# Patient Record
Sex: Male | Born: 1945 | Race: White | Hispanic: No | Marital: Married | Smoking: Former smoker
Health system: Southern US, Community
[De-identification: ages and names within clinical notes are randomized; demographics above are authoritative.]

## PROBLEM LIST (undated history)

## (undated) DIAGNOSIS — G7100 Muscular dystrophy, unspecified: Secondary | ICD-10-CM

## (undated) DIAGNOSIS — I252 Old myocardial infarction: Secondary | ICD-10-CM

## (undated) DIAGNOSIS — Z950 Presence of cardiac pacemaker: Secondary | ICD-10-CM

## (undated) DIAGNOSIS — R42 Dizziness and giddiness: Secondary | ICD-10-CM

## (undated) DIAGNOSIS — G4730 Sleep apnea, unspecified: Secondary | ICD-10-CM

## (undated) DIAGNOSIS — E785 Hyperlipidemia, unspecified: Secondary | ICD-10-CM

## (undated) DIAGNOSIS — I2510 Atherosclerotic heart disease of native coronary artery without angina pectoris: Secondary | ICD-10-CM

## (undated) DIAGNOSIS — I519 Heart disease, unspecified: Secondary | ICD-10-CM

## (undated) HISTORY — PX: BACK SURGERY: SHX140

## (undated) HISTORY — DX: Hyperlipidemia, unspecified: E78.5

## (undated) HISTORY — DX: Old myocardial infarction: I25.2

## (undated) HISTORY — DX: Muscular dystrophy, unspecified: G71.00

## (undated) HISTORY — DX: Sleep apnea, unspecified: G47.30

## (undated) HISTORY — PX: HERNIA REPAIR: SHX51

## (undated) HISTORY — DX: Atherosclerotic heart disease of native coronary artery without angina pectoris: I25.10

## (undated) HISTORY — DX: Heart disease, unspecified: I51.9

## (undated) HISTORY — DX: Dizziness and giddiness: R42

---
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
## 2006-03-23 ENCOUNTER — Ambulatory Visit (HOSPITAL_COMMUNITY): Admission: RE | Admit: 2006-03-23 | Discharge: 2006-03-23 | Payer: Self-pay | Admitting: Cardiology

## 2014-05-30 DIAGNOSIS — I2510 Atherosclerotic heart disease of native coronary artery without angina pectoris: Secondary | ICD-10-CM | POA: Diagnosis not present

## 2014-05-30 DIAGNOSIS — E785 Hyperlipidemia, unspecified: Secondary | ICD-10-CM | POA: Diagnosis not present

## 2014-06-11 DIAGNOSIS — S0990XA Unspecified injury of head, initial encounter: Secondary | ICD-10-CM | POA: Diagnosis not present

## 2014-06-11 DIAGNOSIS — Z87891 Personal history of nicotine dependence: Secondary | ICD-10-CM | POA: Diagnosis not present

## 2014-06-11 DIAGNOSIS — S0181XA Laceration without foreign body of other part of head, initial encounter: Secondary | ICD-10-CM | POA: Diagnosis not present

## 2014-06-11 DIAGNOSIS — W010XXA Fall on same level from slipping, tripping and stumbling without subsequent striking against object, initial encounter: Secondary | ICD-10-CM | POA: Diagnosis not present

## 2014-06-11 DIAGNOSIS — Z7982 Long term (current) use of aspirin: Secondary | ICD-10-CM | POA: Diagnosis not present

## 2014-06-11 DIAGNOSIS — R51 Headache: Secondary | ICD-10-CM | POA: Diagnosis not present

## 2014-06-11 DIAGNOSIS — S01411A Laceration without foreign body of right cheek and temporomandibular area, initial encounter: Secondary | ICD-10-CM | POA: Diagnosis not present

## 2014-06-11 DIAGNOSIS — Z792 Long term (current) use of antibiotics: Secondary | ICD-10-CM | POA: Diagnosis not present

## 2014-06-21 DIAGNOSIS — H3531 Nonexudative age-related macular degeneration: Secondary | ICD-10-CM | POA: Diagnosis not present

## 2014-08-08 DIAGNOSIS — Z125 Encounter for screening for malignant neoplasm of prostate: Secondary | ICD-10-CM | POA: Diagnosis not present

## 2014-08-08 DIAGNOSIS — E782 Mixed hyperlipidemia: Secondary | ICD-10-CM | POA: Diagnosis not present

## 2014-08-08 DIAGNOSIS — Z79899 Other long term (current) drug therapy: Secondary | ICD-10-CM | POA: Diagnosis not present

## 2014-08-08 DIAGNOSIS — Z1211 Encounter for screening for malignant neoplasm of colon: Secondary | ICD-10-CM | POA: Diagnosis not present

## 2014-08-08 DIAGNOSIS — R2689 Other abnormalities of gait and mobility: Secondary | ICD-10-CM | POA: Diagnosis not present

## 2014-11-20 DIAGNOSIS — E785 Hyperlipidemia, unspecified: Secondary | ICD-10-CM

## 2014-11-20 DIAGNOSIS — I2510 Atherosclerotic heart disease of native coronary artery without angina pectoris: Secondary | ICD-10-CM

## 2014-11-20 HISTORY — DX: Hyperlipidemia, unspecified: E78.5

## 2014-11-20 HISTORY — DX: Atherosclerotic heart disease of native coronary artery without angina pectoris: I25.10

## 2015-02-08 DIAGNOSIS — E782 Mixed hyperlipidemia: Secondary | ICD-10-CM | POA: Diagnosis not present

## 2015-02-08 DIAGNOSIS — Z0000 Encounter for general adult medical examination without abnormal findings: Secondary | ICD-10-CM | POA: Diagnosis not present

## 2015-02-08 DIAGNOSIS — Z760 Encounter for issue of repeat prescription: Secondary | ICD-10-CM | POA: Diagnosis not present

## 2015-02-08 DIAGNOSIS — Z79899 Other long term (current) drug therapy: Secondary | ICD-10-CM | POA: Diagnosis not present

## 2015-02-08 DIAGNOSIS — R2689 Other abnormalities of gait and mobility: Secondary | ICD-10-CM | POA: Diagnosis not present

## 2015-02-08 DIAGNOSIS — E780 Pure hypercholesterolemia: Secondary | ICD-10-CM | POA: Diagnosis not present

## 2015-06-20 DIAGNOSIS — E785 Hyperlipidemia, unspecified: Secondary | ICD-10-CM | POA: Diagnosis not present

## 2015-06-20 DIAGNOSIS — I2510 Atherosclerotic heart disease of native coronary artery without angina pectoris: Secondary | ICD-10-CM | POA: Diagnosis not present

## 2015-06-25 DIAGNOSIS — I2510 Atherosclerotic heart disease of native coronary artery without angina pectoris: Secondary | ICD-10-CM | POA: Diagnosis not present

## 2015-06-25 DIAGNOSIS — E785 Hyperlipidemia, unspecified: Secondary | ICD-10-CM | POA: Diagnosis not present

## 2015-08-05 DIAGNOSIS — Z79899 Other long term (current) drug therapy: Secondary | ICD-10-CM | POA: Diagnosis not present

## 2015-08-05 DIAGNOSIS — E782 Mixed hyperlipidemia: Secondary | ICD-10-CM | POA: Diagnosis not present

## 2015-08-05 DIAGNOSIS — D510 Vitamin B12 deficiency anemia due to intrinsic factor deficiency: Secondary | ICD-10-CM | POA: Diagnosis not present

## 2015-10-01 DIAGNOSIS — I2510 Atherosclerotic heart disease of native coronary artery without angina pectoris: Secondary | ICD-10-CM | POA: Diagnosis not present

## 2015-10-01 DIAGNOSIS — E785 Hyperlipidemia, unspecified: Secondary | ICD-10-CM | POA: Diagnosis not present

## 2016-02-10 DIAGNOSIS — Z79899 Other long term (current) drug therapy: Secondary | ICD-10-CM | POA: Diagnosis not present

## 2016-02-10 DIAGNOSIS — G319 Degenerative disease of nervous system, unspecified: Secondary | ICD-10-CM | POA: Diagnosis not present

## 2016-02-10 DIAGNOSIS — E782 Mixed hyperlipidemia: Secondary | ICD-10-CM | POA: Diagnosis not present

## 2016-02-10 DIAGNOSIS — Z0000 Encounter for general adult medical examination without abnormal findings: Secondary | ICD-10-CM | POA: Diagnosis not present

## 2016-02-10 DIAGNOSIS — Z2821 Immunization not carried out because of patient refusal: Secondary | ICD-10-CM | POA: Diagnosis not present

## 2016-03-31 DIAGNOSIS — I2510 Atherosclerotic heart disease of native coronary artery without angina pectoris: Secondary | ICD-10-CM | POA: Diagnosis not present

## 2016-03-31 DIAGNOSIS — E785 Hyperlipidemia, unspecified: Secondary | ICD-10-CM | POA: Diagnosis not present

## 2016-09-23 DIAGNOSIS — E785 Hyperlipidemia, unspecified: Secondary | ICD-10-CM | POA: Diagnosis not present

## 2016-09-23 DIAGNOSIS — I2510 Atherosclerotic heart disease of native coronary artery without angina pectoris: Secondary | ICD-10-CM | POA: Diagnosis not present

## 2017-01-07 DIAGNOSIS — E782 Mixed hyperlipidemia: Secondary | ICD-10-CM | POA: Diagnosis not present

## 2017-01-07 DIAGNOSIS — K649 Unspecified hemorrhoids: Secondary | ICD-10-CM | POA: Diagnosis not present

## 2017-01-07 DIAGNOSIS — R2689 Other abnormalities of gait and mobility: Secondary | ICD-10-CM | POA: Diagnosis not present

## 2017-01-07 DIAGNOSIS — Z79899 Other long term (current) drug therapy: Secondary | ICD-10-CM | POA: Diagnosis not present

## 2017-01-07 DIAGNOSIS — Z6822 Body mass index (BMI) 22.0-22.9, adult: Secondary | ICD-10-CM | POA: Diagnosis not present

## 2017-01-26 ENCOUNTER — Other Ambulatory Visit: Payer: Self-pay | Admitting: Cardiology

## 2017-01-28 NOTE — Telephone Encounter (Signed)
Attempted to contact pt to see if he could continue care with Dr. Tomie Chinaevankar before refilling medication. This seemed to be invalid number for the pt.

## 2017-02-19 DIAGNOSIS — I252 Old myocardial infarction: Secondary | ICD-10-CM

## 2017-02-19 DIAGNOSIS — E785 Hyperlipidemia, unspecified: Secondary | ICD-10-CM

## 2017-02-19 HISTORY — DX: Hyperlipidemia, unspecified: E78.5

## 2017-02-19 HISTORY — DX: Old myocardial infarction: I25.2

## 2017-02-22 DIAGNOSIS — R42 Dizziness and giddiness: Secondary | ICD-10-CM | POA: Diagnosis not present

## 2017-02-22 DIAGNOSIS — I252 Old myocardial infarction: Secondary | ICD-10-CM | POA: Diagnosis not present

## 2017-02-22 DIAGNOSIS — E785 Hyperlipidemia, unspecified: Secondary | ICD-10-CM | POA: Diagnosis not present

## 2017-02-22 DIAGNOSIS — G7100 Muscular dystrophy, unspecified: Secondary | ICD-10-CM | POA: Diagnosis not present

## 2017-02-22 DIAGNOSIS — R9431 Abnormal electrocardiogram [ECG] [EKG]: Secondary | ICD-10-CM | POA: Diagnosis not present

## 2017-02-22 DIAGNOSIS — I2510 Atherosclerotic heart disease of native coronary artery without angina pectoris: Secondary | ICD-10-CM | POA: Diagnosis not present

## 2017-02-22 DIAGNOSIS — I519 Heart disease, unspecified: Secondary | ICD-10-CM

## 2017-02-22 DIAGNOSIS — I440 Atrioventricular block, first degree: Secondary | ICD-10-CM | POA: Diagnosis not present

## 2017-02-22 HISTORY — DX: Muscular dystrophy, unspecified: G71.00

## 2017-02-22 HISTORY — DX: Heart disease, unspecified: I51.9

## 2017-02-22 HISTORY — DX: Dizziness and giddiness: R42

## 2017-03-09 DIAGNOSIS — I519 Heart disease, unspecified: Secondary | ICD-10-CM | POA: Diagnosis not present

## 2017-03-09 DIAGNOSIS — I252 Old myocardial infarction: Secondary | ICD-10-CM | POA: Diagnosis not present

## 2017-03-09 DIAGNOSIS — E785 Hyperlipidemia, unspecified: Secondary | ICD-10-CM | POA: Diagnosis not present

## 2017-03-09 DIAGNOSIS — I2510 Atherosclerotic heart disease of native coronary artery without angina pectoris: Secondary | ICD-10-CM | POA: Diagnosis not present

## 2017-03-09 DIAGNOSIS — G7100 Muscular dystrophy, unspecified: Secondary | ICD-10-CM | POA: Diagnosis not present

## 2017-03-24 DIAGNOSIS — G4733 Obstructive sleep apnea (adult) (pediatric): Secondary | ICD-10-CM | POA: Diagnosis not present

## 2017-03-31 DIAGNOSIS — I361 Nonrheumatic tricuspid (valve) insufficiency: Secondary | ICD-10-CM | POA: Diagnosis not present

## 2017-03-31 DIAGNOSIS — I517 Cardiomegaly: Secondary | ICD-10-CM | POA: Diagnosis not present

## 2017-05-11 DIAGNOSIS — G7100 Muscular dystrophy, unspecified: Secondary | ICD-10-CM | POA: Diagnosis not present

## 2017-05-11 DIAGNOSIS — G4730 Sleep apnea, unspecified: Secondary | ICD-10-CM | POA: Insufficient documentation

## 2017-05-11 DIAGNOSIS — I2510 Atherosclerotic heart disease of native coronary artery without angina pectoris: Secondary | ICD-10-CM | POA: Diagnosis not present

## 2017-05-11 DIAGNOSIS — R42 Dizziness and giddiness: Secondary | ICD-10-CM | POA: Diagnosis not present

## 2017-05-11 DIAGNOSIS — I252 Old myocardial infarction: Secondary | ICD-10-CM | POA: Diagnosis not present

## 2017-05-11 HISTORY — DX: Sleep apnea, unspecified: G47.30

## 2017-05-21 DIAGNOSIS — G4733 Obstructive sleep apnea (adult) (pediatric): Secondary | ICD-10-CM | POA: Diagnosis not present

## 2017-06-08 DIAGNOSIS — G4733 Obstructive sleep apnea (adult) (pediatric): Secondary | ICD-10-CM | POA: Diagnosis not present

## 2017-06-08 DIAGNOSIS — R0600 Dyspnea, unspecified: Secondary | ICD-10-CM | POA: Diagnosis not present

## 2017-07-15 DIAGNOSIS — G4733 Obstructive sleep apnea (adult) (pediatric): Secondary | ICD-10-CM | POA: Diagnosis not present

## 2017-07-16 DIAGNOSIS — G4733 Obstructive sleep apnea (adult) (pediatric): Secondary | ICD-10-CM | POA: Diagnosis not present

## 2017-07-20 DIAGNOSIS — H353132 Nonexudative age-related macular degeneration, bilateral, intermediate dry stage: Secondary | ICD-10-CM | POA: Diagnosis not present

## 2017-08-09 DIAGNOSIS — G4700 Insomnia, unspecified: Secondary | ICD-10-CM | POA: Diagnosis not present

## 2017-08-09 DIAGNOSIS — G4733 Obstructive sleep apnea (adult) (pediatric): Secondary | ICD-10-CM | POA: Diagnosis not present

## 2017-08-09 DIAGNOSIS — R0600 Dyspnea, unspecified: Secondary | ICD-10-CM | POA: Diagnosis not present

## 2017-08-12 DIAGNOSIS — G4733 Obstructive sleep apnea (adult) (pediatric): Secondary | ICD-10-CM | POA: Diagnosis not present

## 2017-08-17 ENCOUNTER — Other Ambulatory Visit: Payer: Self-pay | Admitting: Cardiology

## 2017-08-17 NOTE — Telephone Encounter (Signed)
Left voicemail regarding refill; appointment needed.

## 2017-08-25 DIAGNOSIS — G4733 Obstructive sleep apnea (adult) (pediatric): Secondary | ICD-10-CM | POA: Diagnosis not present

## 2017-09-12 DIAGNOSIS — G4733 Obstructive sleep apnea (adult) (pediatric): Secondary | ICD-10-CM | POA: Diagnosis not present

## 2017-09-24 ENCOUNTER — Other Ambulatory Visit: Payer: Self-pay

## 2017-10-13 ENCOUNTER — Telehealth: Payer: Self-pay

## 2017-10-13 ENCOUNTER — Encounter: Payer: Self-pay | Admitting: Cardiology

## 2017-10-13 ENCOUNTER — Ambulatory Visit: Payer: Medicare Other | Admitting: Cardiology

## 2017-10-13 VITALS — BP 122/64 | HR 51 | Ht 67.0 in | Wt 146.0 lb

## 2017-10-13 DIAGNOSIS — I2510 Atherosclerotic heart disease of native coronary artery without angina pectoris: Secondary | ICD-10-CM | POA: Diagnosis not present

## 2017-10-13 DIAGNOSIS — E782 Mixed hyperlipidemia: Secondary | ICD-10-CM

## 2017-10-13 DIAGNOSIS — G7100 Muscular dystrophy, unspecified: Secondary | ICD-10-CM

## 2017-10-13 DIAGNOSIS — G4730 Sleep apnea, unspecified: Secondary | ICD-10-CM

## 2017-10-13 NOTE — Progress Notes (Signed)
Cardiology Office Note:    Date:  10/13/2017   ID:  Stephen Howell, DOB March 12, 1946, MRN [id number]  PCP:  Heywood Bene, MD  Cardiologist:  Garwin Brothers, MD   Referring MD: No ref. provider found    ASSESSMENT:    1. Coronary artery disease involving native coronary artery of native heart without angina pectoris   2. Mixed hyperlipidemia   3. Muscular dystrophy (HCC)   4. Sleep apnea, unspecified type    PLAN:    In order of problems listed above:  1. Secondary prevention stressed with patient.  Importance of compliance with diet and medications stressed and he vocalized understanding.  His blood pressure stable.  He is limited in activity and ambulation because of muscular dystrophy.  He tries his best to stay active.  He 2. Has been a long time since he has had blood work and he is fasting today so we will obtain complete blood work including fasting lipids. 3. I will try to obtain results of sleep study done at sleep study specialist in [place] and advise him accordingly. 4. We will reinitiate his statin once we review his lipids. 5. Patient will be seen in follow-up appointment in 6 months or earlier if the patient has any concerns    Medication Adjustments/Labs and Tests Ordered: Current medicines are reviewed at length with the patient today.  Concerns regarding medicines are outlined above.  Orders Placed This Encounter  Procedures  . Basic metabolic panel  . CBC with Differential/Platelet  . TSH  . Hepatic function panel  . Lipid panel  . EKG 12-Lead   No orders of the defined types were placed in this encounter.    Stephen Complaint  Patient presents with  . Follow-up  . Coronary Artery Disease     History of Present Illness:    Stephen Howell is a 72 y.o. male.  He has known coronary artery disease.  This patient has been under my care in my previous practice.  He is here now to transfer his care and be established with my current practice.  He  has muscular dystrophy.  He mentions to me that he underwent sleep study but did not get the results of his tests.  Also he mentions to me that he has had issues refilling his medications from the previous practice and therefore he is frustrated and has made an appointment to see me.  He is accompanied by his wife.  He denies any chest pain orthopnea or PND.  At the time of my evaluation, the patient is alert awake oriented and in no distress.  Past Medical History:  Diagnosis Date  . CAD (coronary artery disease) 11/20/2014  . Dizziness 02/22/2017  . Dyslipidemia 11/20/2014  . Hyperlipidemia 02/19/2017  . Left ventricular dysfunction 02/22/2017  . Muscular dystrophy (HCC) 02/22/2017  . 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 Sleep apnea 05/11/2017    Past Surgical History:  Procedure Laterality Date  . BACK SURGERY    . HERNIA REPAIR      Current Medications: Current Meds  Medication Sig  . aspirin EC 81 MG tablet Take 81 mg by mouth daily.  . Coenzyme Q-10 200 MG CAPS Take 200 mg by mouth daily.  . nitroGLYCERIN (NITROSTAT) 0.4 MG SL tablet Place 0.4 mg under the tongue every 5 (five) minutes as needed.  . [DISCONTINUED] clopidogrel (PLAVIX) 75 MG tablet Take 1 tablet by mouth daily.  . [DISCONTINUED] pravastatin (PRAVACHOL) 10 MG tablet  Take 1 tablet by mouth daily.     Allergies:   Atorvastatin   Social History   Socioeconomic History  . Marital status: Married    Spouse name: Not on file  . Number of children: Not on file  . Years of education: Not on file  . Highest education level: Not on file  Occupational History  . Not on file  Social Needs  . Financial resource strain: Not on file  . Food insecurity:    Worry: Not on file    Inability: Not on file  . Transportation needs:    Medical: Not on file    Non-medical: Not on file  Tobacco Use  . Smoking status: Former Games developer  . Smokeless tobacco: Never Used  Substance and Sexual Activity  . Alcohol use: Not on  file  . Drug use: Not on file  . Sexual activity: Not on file  Lifestyle  . Physical activity:    Days per week: Not on file    Minutes per session: Not on file  . Stress: Not on file  Relationships  . Social connections:    Talks on phone: Not on file    Gets together: Not on file    Attends religious service: Not on file    Active member of club or organization: Not on file    Attends meetings of clubs or organizations: Not on file    Relationship status: Not on file  Other Topics Concern  . Not on file  Social History Narrative  . Not on file     Family History: The patient's family history includes Cancer in his mother; Muscular dystrophy in his brother.  ROS:   Please see the history of present illness.    All other systems reviewed and are negative.  EKGs/Labs/Other Studies Reviewed:    The following studies were reviewed today: EKG reveals sinus rhythm with first-degree AV block and nonspecific ST-T changes.   Recent Labs: No results found for requested labs within last 8760 hours.  Recent Lipid Panel No results found for: CHOL, TRIG, HDL, CHOLHDL, VLDL, LDLCALC, LDLDIRECT  Physical Exam:    VS:  BP 122/64 (BP Location: Right Arm, Patient Position: Sitting, Cuff Size: Normal)   Pulse (!) 51   Ht  (1.702 m)   Wt 146 lb (66.2 kg)   SpO2 95%   BMI 22.87 kg/m     Wt Readings from Last 3 Encounters:  10/13/17 146 lb (66.2 kg)     GEN: Patient is in no acute distress HEENT: Normal NECK: No JVD; No carotid bruits LYMPHATICS: No lymphadenopathy CARDIAC: Hear sounds regular, 2/6 systolic murmur at the apex. RESPIRATORY:  Clear to auscultation without rales, wheezing or rhonchi  ABDOMEN: Soft, non-tender, non-distended MUSCULOSKELETAL:  No edema; No deformity  SKIN: Warm and dry NEUROLOGIC:  Alert and oriented x 3 PSYCHIATRIC:  Normal affect   Signed, Garwin Brothers, MD  10/13/2017 9:20 AM    [facility]

## 2017-10-13 NOTE — Telephone Encounter (Signed)
Called patient to discuss pulmonary referral.

## 2017-10-13 NOTE — Patient Instructions (Addendum)
Medication Instructions:  Your physician recommends that you continue on your current medications as directed. Please refer to the Current Medication list given to you today.  Labwork: Your physician recommends that you have the following labs drawn: BMP, CBC, TSH, liver and lipid panel.  Testing/Procedures: None  Follow-Up: Your physician recommends that you schedule a follow-up appointment in: 6 months  Any Other Special Instructions Will Be Listed Below (If Applicable).  Sleep study will be requested from the office of Dr. Blenda Nicely.   If you need a refill on your cardiac medications before your next appointment, please call your pharmacy.   [facility]  Garey Ham, RN, BSN

## 2017-10-14 ENCOUNTER — Telehealth: Payer: Self-pay

## 2017-10-14 LAB — BASIC METABOLIC PANEL
BUN/Creatinine Ratio: 20 (ref 10–24)
BUN: 16 mg/dL (ref 8–27)
CALCIUM: 9.3 mg/dL (ref 8.6–10.2)
CO2: 27 mmol/L (ref 20–29)
CREATININE: 0.8 mg/dL (ref 0.76–1.27)
Chloride: 106 mmol/L (ref 96–106)
GFR calc Af Amer: 102 mL/min/{1.73_m2} (ref >59)
GFR, EST NON AFRICAN AMERICAN: 89 mL/min/{1.73_m2} (ref >59)
GLUCOSE: 93 mg/dL (ref 65–99)
POTASSIUM: 4.7 mmol/L (ref 3.5–5.2)
SODIUM: 147 mmol/L — AB (ref 134–144)

## 2017-10-14 LAB — CBC WITH DIFFERENTIAL/PLATELET
BASOS: 0 %
Basophils Absolute: 0 10*3/uL (ref 0.0–0.2)
EOS (ABSOLUTE): 0.3 10*3/uL (ref 0.0–0.4)
Eos: 4 %
Hematocrit: 44.4 % (ref 37.5–51.0)
Hemoglobin: 14.7 g/dL (ref 13.0–17.7)
IMMATURE GRANS (ABS): 0 10*3/uL (ref 0.0–0.1)
IMMATURE GRANULOCYTES: 0 %
LYMPHS: 28 %
Lymphocytes Absolute: 2 10*3/uL (ref 0.7–3.1)
MCH: 30.1 pg (ref 26.6–33.0)
MCHC: 33.1 g/dL (ref 31.5–35.7)
MCV: 91 fL (ref 79–97)
MONOS ABS: 0.5 10*3/uL (ref 0.1–0.9)
Monocytes: 7 %
Neutrophils Absolute: 4.1 10*3/uL (ref 1.4–7.0)
Neutrophils: 61 %
PLATELETS: 184 10*3/uL (ref 150–450)
RBC: 4.88 x10E6/uL (ref 4.14–5.80)
RDW: 15.4 % (ref 12.3–15.4)
WBC: 6.9 10*3/uL (ref 3.4–10.8)

## 2017-10-14 LAB — HEPATIC FUNCTION PANEL
ALT: 14 IU/L (ref 0–44)
AST: 14 IU/L (ref 0–40)
Albumin: 3.9 g/dL (ref 3.5–4.8)
Alkaline Phosphatase: 45 IU/L (ref 39–117)
BILIRUBIN TOTAL: 0.4 mg/dL (ref 0.0–1.2)
Bilirubin, Direct: 0.11 mg/dL (ref 0.00–0.40)
Total Protein: 6.2 g/dL (ref 6.0–8.5)

## 2017-10-14 LAB — TSH: TSH: 1.01 u[IU]/mL (ref 0.450–4.500)

## 2017-10-14 LAB — LIPID PANEL
CHOLESTEROL TOTAL: 173 mg/dL (ref 100–199)
Chol/HDL Ratio: 3.1 ratio (ref 0.0–5.0)
HDL: 56 mg/dL (ref >39)
LDL Calculated: 97 mg/dL (ref 0–99)
TRIGLYCERIDES: 102 mg/dL (ref 0–149)
VLDL Cholesterol Cal: 20 mg/dL (ref 5–40)

## 2017-10-14 NOTE — Telephone Encounter (Signed)
Left message with son for the patient to call the office back regarding results.

## 2017-10-15 MED ORDER — PRAVASTATIN SODIUM 10 MG PO TABS: 10.0000 mg | ORAL_TABLET | Freq: Every day | ORAL | 2 refills | Status: DC

## 2017-10-15 NOTE — Addendum Note (Signed)
Addended by: Lamona Curl on: 10/15/2017 04:34 PM   Modules accepted: Orders

## 2017-10-15 NOTE — Telephone Encounter (Signed)
Wife returned out call and I let her know the blood work showed no changes and to stay on statin but I'm not sure which one we need to call in. Also there is a note in here about a referral but not sure what needed to be relayed. Please advise.

## 2017-10-15 NOTE — Telephone Encounter (Signed)
After reviewing patients past medications with Santina Evans, RN a Rx for pravastatin  one tablet daily #90 was sent to [facility].  Patients wife Windell Moulding was advised that pravastatin was sent to pharmacy. Also, advised that Morrie Sheldon will contact patient regarding pulmonology referral once she returns to office.  Patients wife verbalized understanding.

## 2017-10-19 ENCOUNTER — Other Ambulatory Visit: Payer: Self-pay

## 2017-10-19 DIAGNOSIS — G4733 Obstructive sleep apnea (adult) (pediatric): Secondary | ICD-10-CM

## 2017-10-19 NOTE — Telephone Encounter (Signed)
Spoke with patient and inquired if patient would be willing to follow-up with a [facility] pulmonologist rather than continue care in [place]. Patient was agreeable, order has been placed.

## 2018-01-20 DIAGNOSIS — J189 Pneumonia, unspecified organism: Secondary | ICD-10-CM | POA: Diagnosis not present

## 2018-01-25 DIAGNOSIS — J9801 Acute bronchospasm: Secondary | ICD-10-CM | POA: Diagnosis not present

## 2018-01-25 DIAGNOSIS — R5381 Other malaise: Secondary | ICD-10-CM | POA: Diagnosis not present

## 2018-01-25 DIAGNOSIS — Z6822 Body mass index (BMI) 22.0-22.9, adult: Secondary | ICD-10-CM | POA: Diagnosis not present

## 2018-01-25 DIAGNOSIS — J189 Pneumonia, unspecified organism: Secondary | ICD-10-CM | POA: Diagnosis not present

## 2018-01-25 DIAGNOSIS — R5383 Other fatigue: Secondary | ICD-10-CM | POA: Diagnosis not present

## 2018-01-27 DIAGNOSIS — J189 Pneumonia, unspecified organism: Secondary | ICD-10-CM | POA: Diagnosis not present

## 2018-01-27 DIAGNOSIS — Z2821 Immunization not carried out because of patient refusal: Secondary | ICD-10-CM | POA: Diagnosis not present

## 2018-01-27 DIAGNOSIS — Z0000 Encounter for general adult medical examination without abnormal findings: Secondary | ICD-10-CM | POA: Diagnosis not present

## 2018-01-31 DIAGNOSIS — G4733 Obstructive sleep apnea (adult) (pediatric): Secondary | ICD-10-CM | POA: Diagnosis not present

## 2018-01-31 DIAGNOSIS — R0600 Dyspnea, unspecified: Secondary | ICD-10-CM | POA: Diagnosis not present

## 2018-02-01 DIAGNOSIS — G4733 Obstructive sleep apnea (adult) (pediatric): Secondary | ICD-10-CM | POA: Diagnosis not present

## 2018-02-12 DIAGNOSIS — G4733 Obstructive sleep apnea (adult) (pediatric): Secondary | ICD-10-CM | POA: Diagnosis not present

## 2018-02-21 DIAGNOSIS — J189 Pneumonia, unspecified organism: Secondary | ICD-10-CM | POA: Diagnosis not present

## 2018-03-14 DIAGNOSIS — G4733 Obstructive sleep apnea (adult) (pediatric): Secondary | ICD-10-CM | POA: Diagnosis not present

## 2018-03-29 DIAGNOSIS — Z7982 Long term (current) use of aspirin: Secondary | ICD-10-CM | POA: Diagnosis not present

## 2018-03-29 DIAGNOSIS — J9811 Atelectasis: Secondary | ICD-10-CM | POA: Diagnosis not present

## 2018-03-29 DIAGNOSIS — I252 Old myocardial infarction: Secondary | ICD-10-CM | POA: Diagnosis not present

## 2018-03-29 DIAGNOSIS — R070 Pain in throat: Secondary | ICD-10-CM | POA: Diagnosis not present

## 2018-03-29 DIAGNOSIS — R0602 Shortness of breath: Secondary | ICD-10-CM | POA: Diagnosis not present

## 2018-03-29 DIAGNOSIS — I2510 Atherosclerotic heart disease of native coronary artery without angina pectoris: Secondary | ICD-10-CM | POA: Diagnosis not present

## 2018-03-29 DIAGNOSIS — R1310 Dysphagia, unspecified: Secondary | ICD-10-CM | POA: Diagnosis not present

## 2018-03-29 DIAGNOSIS — J189 Pneumonia, unspecified organism: Secondary | ICD-10-CM | POA: Diagnosis not present

## 2018-03-29 DIAGNOSIS — Z79899 Other long term (current) drug therapy: Secondary | ICD-10-CM | POA: Diagnosis not present

## 2018-03-30 DIAGNOSIS — I2510 Atherosclerotic heart disease of native coronary artery without angina pectoris: Secondary | ICD-10-CM | POA: Diagnosis not present

## 2018-03-30 DIAGNOSIS — R2681 Unsteadiness on feet: Secondary | ICD-10-CM | POA: Diagnosis not present

## 2018-03-30 DIAGNOSIS — R1310 Dysphagia, unspecified: Secondary | ICD-10-CM | POA: Diagnosis not present

## 2018-03-30 DIAGNOSIS — G7100 Muscular dystrophy, unspecified: Secondary | ICD-10-CM | POA: Diagnosis not present

## 2018-03-31 DIAGNOSIS — R1310 Dysphagia, unspecified: Secondary | ICD-10-CM | POA: Diagnosis not present

## 2018-03-31 DIAGNOSIS — I2510 Atherosclerotic heart disease of native coronary artery without angina pectoris: Secondary | ICD-10-CM | POA: Diagnosis not present

## 2018-03-31 DIAGNOSIS — G7100 Muscular dystrophy, unspecified: Secondary | ICD-10-CM | POA: Diagnosis not present

## 2018-04-01 DIAGNOSIS — I2510 Atherosclerotic heart disease of native coronary artery without angina pectoris: Secondary | ICD-10-CM | POA: Diagnosis not present

## 2018-04-01 DIAGNOSIS — Z9181 History of falling: Secondary | ICD-10-CM | POA: Diagnosis not present

## 2018-04-01 DIAGNOSIS — I959 Hypotension, unspecified: Secondary | ICD-10-CM | POA: Diagnosis not present

## 2018-04-01 DIAGNOSIS — R531 Weakness: Secondary | ICD-10-CM | POA: Diagnosis not present

## 2018-04-01 DIAGNOSIS — Z7982 Long term (current) use of aspirin: Secondary | ICD-10-CM | POA: Diagnosis not present

## 2018-04-01 DIAGNOSIS — I252 Old myocardial infarction: Secondary | ICD-10-CM | POA: Diagnosis not present

## 2018-04-01 DIAGNOSIS — Z79899 Other long term (current) drug therapy: Secondary | ICD-10-CM | POA: Diagnosis not present

## 2018-04-01 DIAGNOSIS — Z87891 Personal history of nicotine dependence: Secondary | ICD-10-CM | POA: Diagnosis not present

## 2018-04-01 DIAGNOSIS — R001 Bradycardia, unspecified: Secondary | ICD-10-CM | POA: Diagnosis not present

## 2018-04-01 DIAGNOSIS — R1310 Dysphagia, unspecified: Secondary | ICD-10-CM | POA: Diagnosis not present

## 2018-04-04 DIAGNOSIS — Z6822 Body mass index (BMI) 22.0-22.9, adult: Secondary | ICD-10-CM | POA: Diagnosis not present

## 2018-04-04 DIAGNOSIS — G1220 Motor neuron disease, unspecified: Secondary | ICD-10-CM | POA: Diagnosis not present

## 2018-04-06 DIAGNOSIS — R1312 Dysphagia, oropharyngeal phase: Secondary | ICD-10-CM | POA: Diagnosis not present

## 2018-04-08 DIAGNOSIS — Z7982 Long term (current) use of aspirin: Secondary | ICD-10-CM | POA: Diagnosis not present

## 2018-04-08 DIAGNOSIS — I2510 Atherosclerotic heart disease of native coronary artery without angina pectoris: Secondary | ICD-10-CM | POA: Diagnosis not present

## 2018-04-08 DIAGNOSIS — I252 Old myocardial infarction: Secondary | ICD-10-CM | POA: Diagnosis not present

## 2018-04-08 DIAGNOSIS — Z9181 History of falling: Secondary | ICD-10-CM | POA: Diagnosis not present

## 2018-04-08 DIAGNOSIS — R1310 Dysphagia, unspecified: Secondary | ICD-10-CM | POA: Diagnosis not present

## 2018-04-11 ENCOUNTER — Ambulatory Visit: Payer: Medicare Other | Admitting: Cardiology

## 2018-04-11 DIAGNOSIS — R1310 Dysphagia, unspecified: Secondary | ICD-10-CM | POA: Diagnosis not present

## 2018-04-14 DIAGNOSIS — G4733 Obstructive sleep apnea (adult) (pediatric): Secondary | ICD-10-CM | POA: Diagnosis not present

## 2018-04-14 DIAGNOSIS — I252 Old myocardial infarction: Secondary | ICD-10-CM | POA: Diagnosis not present

## 2018-04-14 DIAGNOSIS — R1310 Dysphagia, unspecified: Secondary | ICD-10-CM | POA: Diagnosis not present

## 2018-04-14 DIAGNOSIS — Z9181 History of falling: Secondary | ICD-10-CM | POA: Diagnosis not present

## 2018-04-14 DIAGNOSIS — Z7982 Long term (current) use of aspirin: Secondary | ICD-10-CM | POA: Diagnosis not present

## 2018-04-14 DIAGNOSIS — I2510 Atherosclerotic heart disease of native coronary artery without angina pectoris: Secondary | ICD-10-CM | POA: Diagnosis not present

## 2018-04-19 DIAGNOSIS — R1310 Dysphagia, unspecified: Secondary | ICD-10-CM | POA: Diagnosis not present

## 2018-04-19 DIAGNOSIS — Z9181 History of falling: Secondary | ICD-10-CM | POA: Diagnosis not present

## 2018-04-19 DIAGNOSIS — I2510 Atherosclerotic heart disease of native coronary artery without angina pectoris: Secondary | ICD-10-CM | POA: Diagnosis not present

## 2018-04-19 DIAGNOSIS — I252 Old myocardial infarction: Secondary | ICD-10-CM | POA: Diagnosis not present

## 2018-04-19 DIAGNOSIS — Z7982 Long term (current) use of aspirin: Secondary | ICD-10-CM | POA: Diagnosis not present

## 2018-04-26 DIAGNOSIS — I2510 Atherosclerotic heart disease of native coronary artery without angina pectoris: Secondary | ICD-10-CM | POA: Diagnosis not present

## 2018-04-26 DIAGNOSIS — I252 Old myocardial infarction: Secondary | ICD-10-CM | POA: Diagnosis not present

## 2018-04-26 DIAGNOSIS — Z9181 History of falling: Secondary | ICD-10-CM | POA: Diagnosis not present

## 2018-04-26 DIAGNOSIS — R1310 Dysphagia, unspecified: Secondary | ICD-10-CM | POA: Diagnosis not present

## 2018-04-26 DIAGNOSIS — Z7982 Long term (current) use of aspirin: Secondary | ICD-10-CM | POA: Diagnosis not present

## 2018-04-28 DIAGNOSIS — Z7982 Long term (current) use of aspirin: Secondary | ICD-10-CM | POA: Diagnosis not present

## 2018-04-28 DIAGNOSIS — I2510 Atherosclerotic heart disease of native coronary artery without angina pectoris: Secondary | ICD-10-CM | POA: Diagnosis not present

## 2018-04-28 DIAGNOSIS — I252 Old myocardial infarction: Secondary | ICD-10-CM | POA: Diagnosis not present

## 2018-04-28 DIAGNOSIS — R1310 Dysphagia, unspecified: Secondary | ICD-10-CM | POA: Diagnosis not present

## 2018-04-28 DIAGNOSIS — Z9181 History of falling: Secondary | ICD-10-CM | POA: Diagnosis not present

## 2018-05-03 ENCOUNTER — Ambulatory Visit: Payer: Medicare Other | Admitting: Cardiology

## 2018-05-03 ENCOUNTER — Encounter: Payer: Self-pay | Admitting: Cardiology

## 2018-05-03 VITALS — BP 110/62 | HR 52 | Ht 67.0 in | Wt 148.0 lb

## 2018-05-03 DIAGNOSIS — E782 Mixed hyperlipidemia: Secondary | ICD-10-CM | POA: Diagnosis not present

## 2018-05-03 DIAGNOSIS — I2510 Atherosclerotic heart disease of native coronary artery without angina pectoris: Secondary | ICD-10-CM

## 2018-05-03 DIAGNOSIS — G7100 Muscular dystrophy, unspecified: Secondary | ICD-10-CM

## 2018-05-03 LAB — TSH: TSH: 1.31 u[IU]/mL (ref 0.450–4.500)

## 2018-05-03 NOTE — Progress Notes (Signed)
Cardiology Office Note:    Date:  05/03/2018   ID:  HAMSA LAURICH, DOB Nov 20, 1945, MRN [id number]  PCP:  Heywood Bene, MD  Cardiologist:  Garwin Brothers, MD   Referring MD: Heywood Bene, MD    ASSESSMENT:    1. Coronary artery disease involving native coronary artery of native heart without angina pectoris   2. Mixed hyperlipidemia   3. Muscular dystrophy (HCC)    PLAN:    In order of problems listed above:  1. Secondary prevention stressed with the patient.  Importance of compliance with diet and medication stressed and he vocalized understanding.  His blood pressure is stable.  Diet was discussed for dyslipidemia.  I reviewed emergency room records.  Also in view of the fact that he has not had a lipid check for some time we will do a lipid check and a TSH.  The TSH will be for bradycardia arrhythmia issues. 2. Patient will be seen in follow-up appointment in 6 months or earlier if the patient has any concerns    Medication Adjustments/Labs and Tests Ordered: Current medicines are reviewed at length with the patient today.  Concerns regarding medicines are outlined above.  Orders Placed This Encounter  Procedures  . TSH  . Lipid panel  . EKG 12-Lead   No orders of the defined types were placed in this encounter.    Chief Complaint  Patient presents with  . Follow-up     History of Present Illness:    Stephen Howell is a 72 y.o. male.  Patient has history of coronary artery disease.  He denies any problems at this time and takes care of activities of daily living.  No chest pain orthopnea or PND.  The patient went to the emergency room for what appears to be noncardiac related symptoms and was evaluated and discharged.  Subsequently is done well.  He has issues with bradycardia.  He denies any dizzy spells or any syncopal episodes.  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  His wife concurs that he is asymptomatic.  At the time of my  evaluation, the patient is alert awake oriented and in no distress.  Past Medical History:  Diagnosis Date  . CAD (coronary artery disease) 11/20/2014  . Dizziness 02/22/2017  . Dyslipidemia 11/20/2014  . Hyperlipidemia 02/19/2017  . Left ventricular dysfunction 02/22/2017  . Muscular dystrophy (HCC) 02/22/2017  . 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 Sleep apnea 05/11/2017    Past Surgical History:  Procedure Laterality Date  . BACK SURGERY    . HERNIA REPAIR      Current Medications: Current Meds  Medication Sig  . aspirin EC 81 MG tablet Take 81 mg by mouth daily.  . Coenzyme Q-10 200 MG CAPS Take 200 mg by mouth daily.  . nitroGLYCERIN (NITROSTAT) 0.4 MG SL tablet Place 0.4 mg under the tongue every 5 (five) minutes as needed.  . pravastatin (PRAVACHOL) 10 MG tablet Take 1 tablet (10 mg total) by mouth daily.     Allergies:   Atorvastatin   Social History   Socioeconomic History  . Marital status: Married    Spouse name: Not on file  . Number of children: Not on file  . Years of education: Not on file  . Highest education level: Not on file  Occupational History  . Not on file  Social Needs  . Financial resource strain: Not on file  . Food insecurity:  Worry: Not on file    Inability: Not on file  . Transportation needs:    Medical: Not on file    Non-medical: Not on file  Tobacco Use  . Smoking status: Former Games developermoker  . Smokeless tobacco: Never Used  Substance and Sexual Activity  . Alcohol use: Not on file  . Drug use: Not on file  . Sexual activity: Not on file  Lifestyle  . Physical activity:    Days per week: Not on file    Minutes per session: Not on file  . Stress: Not on file  Relationships  . Social connections:    Talks on phone: Not on file    Gets together: Not on file    Attends religious service: Not on file    Active member of club or organization: Not on file    Attends meetings of clubs or organizations: Not on file     Relationship status: Not on file  Other Topics Concern  . Not on file  Social History Narrative  . Not on file     Family History: The patient's family history includes Cancer in his mother; Muscular dystrophy in his brother.  ROS:   Please see the history of present illness.    All other systems reviewed and are negative.  EKGs/Labs/Other Studies Reviewed:    The following studies were reviewed today: EKG has revealed sinus rhythm with first-degree AV block with bradycardia.   Recent Labs: 10/13/2017: ALT 14; BUN 16; Creatinine, Ser 0.80; Hemoglobin 14.7; Platelets 184; Potassium 4.7; Sodium 147; TSH 1.010  Recent Lipid Panel    Component Value Date/Time   CHOL 173 10/13/2017 0925   TRIG 102 10/13/2017 0925   HDL 56 10/13/2017 0925   CHOLHDL 3.1 10/13/2017 0925   LDLCALC 97 10/13/2017 0925    Physical Exam:    VS:  BP 110/62 (BP Location: Right Arm, Patient Position: Sitting, Cuff Size: Normal)   Pulse (!) 52   Ht 5\' 7"  (1.702 m)   Wt 148 lb (67.1 kg)   SpO2 92%   BMI 23.18 kg/m     Wt Readings from Last 3 Encounters:  05/03/18 148 lb (67.1 kg)  10/13/17 146 lb (66.2 kg)     GEN: Patient is in no acute distress HEENT: Normal NECK: No JVD; No carotid bruits LYMPHATICS: No lymphadenopathy CARDIAC: Hear sounds regular, 2/6 systolic murmur at the apex. RESPIRATORY:  Clear to auscultation without rales, wheezing or rhonchi  ABDOMEN: Soft, non-tender, non-distended MUSCULOSKELETAL:  No edema; No deformity  SKIN: Warm and dry NEUROLOGIC:  Alert and oriented x 3 PSYCHIATRIC:  Normal affect   Signed, Garwin Brothersajan R Revankar, MD  05/03/2018 10:31 AM    [facility]

## 2018-05-03 NOTE — Patient Instructions (Signed)
Medication Instructions:  Your physician recommends that you continue on your current medications as directed. Please refer to the Current Medication list given to you today.  If you need a refill on your cardiac medications before your next appointment, please call your pharmacy.   Lab work: Your physician recommends that you return for lab work today: TSH AND LIPIDS  If you have labs (blood work) drawn today and your tests are completely normal, you will receive your results only by: Marland Kitchen. MyChart Message (if you have MyChart) OR . A paper copy in the mail If you have any lab test that is abnormal or we need to change your treatment, we will call you to review the results.  Testing/Procedures: None  Follow-Up: At [facility], you and your health needs are our priority.  As part of our continuing mission to provide you with exceptional heart care, we have created designated Provider Care Teams.  These Care Teams include your primary Cardiologist (physician) and Advanced Practice Providers (APPs -  Physician Assistants and Nurse Practitioners) who all work together to provide you with the care you need, when you need it. You will need a follow up appointment in 6 months.  Please call our office 2 months in advance to schedule this appointment.  You may see No primary care provider on file. or another member of our [facility] Provider Team in [place]: Gypsy Balsamobert Krasowski, MD . Norman HerrlichBrian Munley, MD  Any Other Special Instructions Will Be Listed Below (If Applicable).

## 2018-05-04 DIAGNOSIS — R1310 Dysphagia, unspecified: Secondary | ICD-10-CM | POA: Diagnosis not present

## 2018-05-04 DIAGNOSIS — I2510 Atherosclerotic heart disease of native coronary artery without angina pectoris: Secondary | ICD-10-CM | POA: Diagnosis not present

## 2018-05-04 DIAGNOSIS — I252 Old myocardial infarction: Secondary | ICD-10-CM | POA: Diagnosis not present

## 2018-05-04 DIAGNOSIS — Z7982 Long term (current) use of aspirin: Secondary | ICD-10-CM | POA: Diagnosis not present

## 2018-05-04 DIAGNOSIS — Z9181 History of falling: Secondary | ICD-10-CM | POA: Diagnosis not present

## 2018-05-05 DIAGNOSIS — I2510 Atherosclerotic heart disease of native coronary artery without angina pectoris: Secondary | ICD-10-CM | POA: Diagnosis not present

## 2018-05-05 DIAGNOSIS — I252 Old myocardial infarction: Secondary | ICD-10-CM | POA: Diagnosis not present

## 2018-05-05 DIAGNOSIS — Z9181 History of falling: Secondary | ICD-10-CM | POA: Diagnosis not present

## 2018-05-05 DIAGNOSIS — R1310 Dysphagia, unspecified: Secondary | ICD-10-CM | POA: Diagnosis not present

## 2018-05-05 DIAGNOSIS — Z7982 Long term (current) use of aspirin: Secondary | ICD-10-CM | POA: Diagnosis not present

## 2018-06-15 ENCOUNTER — Ambulatory Visit: Payer: Medicare Other | Admitting: Diagnostic Neuroimaging

## 2018-09-05 ENCOUNTER — Other Ambulatory Visit: Payer: Self-pay | Admitting: Cardiology

## 2018-11-28 DIAGNOSIS — M48061 Spinal stenosis, lumbar region without neurogenic claudication: Secondary | ICD-10-CM | POA: Diagnosis not present

## 2018-11-28 DIAGNOSIS — M545 Low back pain: Secondary | ICD-10-CM | POA: Diagnosis not present

## 2018-11-30 ENCOUNTER — Encounter: Payer: Self-pay | Admitting: Cardiology

## 2018-11-30 ENCOUNTER — Other Ambulatory Visit: Payer: Self-pay

## 2018-11-30 ENCOUNTER — Ambulatory Visit (INDEPENDENT_AMBULATORY_CARE_PROVIDER_SITE_OTHER): Payer: Medicare Other | Admitting: Cardiology

## 2018-11-30 VITALS — BP 122/68 | HR 48 | Ht 67.0 in | Wt 160.0 lb

## 2018-11-30 DIAGNOSIS — I2510 Atherosclerotic heart disease of native coronary artery without angina pectoris: Secondary | ICD-10-CM | POA: Diagnosis not present

## 2018-11-30 DIAGNOSIS — Z1329 Encounter for screening for other suspected endocrine disorder: Secondary | ICD-10-CM | POA: Diagnosis not present

## 2018-11-30 DIAGNOSIS — E782 Mixed hyperlipidemia: Secondary | ICD-10-CM | POA: Diagnosis not present

## 2018-11-30 DIAGNOSIS — I252 Old myocardial infarction: Secondary | ICD-10-CM | POA: Diagnosis not present

## 2018-11-30 DIAGNOSIS — I519 Heart disease, unspecified: Secondary | ICD-10-CM

## 2018-11-30 DIAGNOSIS — R600 Localized edema: Secondary | ICD-10-CM

## 2018-11-30 HISTORY — DX: Localized edema: R60.0

## 2018-11-30 NOTE — Patient Instructions (Signed)
Medication Instructions:  Your physician recommends that you continue on your current medications as directed. Please refer to the Current Medication list given to you today.  If you need a refill on your cardiac medications before your next appointment, please call your pharmacy.   Lab work: Your physician recommends that you return for BMP, BNP, TSH, CBC, liver and lipid panel If you have labs (blood work) drawn today and your tests are completely normal, you will receive your results only by:  MyChart Message (if you have MyChart) OR  A paper copy in the mail If you have any lab test that is abnormal or we need to change your treatment, we will call you to review the results.  Testing/Procedures: Your physician has requested that you have an echocardiogram. Echocardiography is a painless test that uses sound waves to create images of your heart. It provides your doctor with information about the size and shape of your heart and how well your hearts chambers and valves are working. This procedure takes approximately one hour. There are no restrictions for this procedure.    Follow-Up: At [facility], you and your health needs are our priority.  As part of our continuing mission to provide you with exceptional heart care, we have created designated Provider Care Teams.  These Care Teams include your primary Cardiologist (physician) and Advanced Practice Providers (APPs -  Physician Assistants and Nurse Practitioners) who all work together to provide you with the care you need, when you need it. You will need a follow up appointment in 2 weeks.    Any Other Special Instructions Will Be Listed Below    Echocardiogram An echocardiogram is a procedure that uses painless sound waves (ultrasound) to produce an image of the heart. Images from an echocardiogram can provide important information about:  Signs of coronary artery disease (CAD).  Aneurysm detection. An aneurysm is a weak or  damaged part of an artery wall that bulges out from the normal force of blood pumping through the body.  Heart size and shape. Changes in the size or shape of the heart can be associated with certain conditions, including heart failure, aneurysm, and CAD.  Heart muscle function.  Heart valve function.  Signs of a past heart attack.  Fluid buildup around the heart.  Thickening of the heart muscle.  A tumor or infectious growth around the heart valves. Tell a health care provider about:  Any allergies you have.  All medicines you are taking, including vitamins, herbs, eye drops, creams, and over-the-counter medicines.  Any blood disorders you have.  Any surgeries you have had.  Any medical conditions you have.  Whether you are pregnant or may be pregnant. What are the risks? Generally, this is a safe procedure. However, problems may occur, including:  Allergic reaction to dye (contrast) that may be used during the procedure. What happens before the procedure? No specific preparation is needed. You may eat and drink normally. What happens during the procedure?   An IV tube may be inserted into one of your veins.  You may receive contrast through this tube. A contrast is an injection that improves the quality of the pictures from your heart.  A gel will be applied to your chest.  A wand-like tool (transducer) will be moved over your chest. The gel will help to transmit the sound waves from the transducer.  The sound waves will harmlessly bounce off of your heart to allow the heart images to be captured in  real-time motion. The images will be recorded on a computer. The procedure may vary among health care providers and hospitals. What happens after the procedure?  You may return to your normal, everyday life, including diet, activities, and medicines, unless your health care provider tells you not to do that. Summary  An echocardiogram is a procedure that uses painless  sound waves (ultrasound) to produce an image of the heart.  Images from an echocardiogram can provide important information about the size and shape of your heart, heart muscle function, heart valve function, and fluid buildup around your heart.  You do not need to do anything to prepare before this procedure. You may eat and drink normally.  After the echocardiogram is completed, you may return to your normal, everyday life, unless your health care provider tells you not to do that. This information is not intended to replace advice given to you by your health care provider. Make sure you discuss any questions you have with your health care provider. Document Released: 05/08/2000 Document Revised: 09/01/2018 Document Reviewed: 06/13/2016 Elsevier Patient Education  2020 Elsevier Inc.   Deep Vein Thrombosis  Deep vein thrombosis (DVT) is a condition in which a blood clot forms in a deep vein, such as a lower leg, thigh, or arm vein. A clot is blood that has thickened into a gel or solid. This condition is dangerous. It can lead to serious and even life-threatening complications if the clot travels to the lungs and causes a blockage (pulmonary embolism). It can also damage veins in the leg. This can result in leg pain, swelling, discoloration, and sores (post-thrombotic syndrome). What are the causes? This condition may be caused by:  A slowdown of blood flow.  Damage to a vein.  A condition that causes blood to clot more easily, such as an inherited clotting disorder. What increases the risk? The following factors may make you more likely to develop this condition:  Being overweight.  Being older, especially over age 75.  Sitting or lying down for more than four hours.  Being in the hospital.  Lack of physical activity (sedentary lifestyle).  Pregnancy, being in childbirth, or having recently given birth.  Taking medicines that contain estrogen, such as medicines to prevent  pregnancy.  Smoking.  A history of any of the following: ? Blood clots or a blood clotting disease. ? Peripheral vascular disease. ? Inflammatory bowel disease. ? Cancer. ? Heart disease. ? Genetic conditions that affect how your blood clots, such as Factor V Leiden mutation. ? Neurological diseases that affect your legs (leg paresis). ? A recent injury, such as a car accident. ? Major or lengthy surgery. ? A central line placed inside a large vein. What are the signs or symptoms? Symptoms of this condition include:  Swelling, pain, or tenderness in an arm or leg.  Warmth, redness, or discoloration in an arm or leg. If the clot is in your leg, symptoms may be more noticeable or worse when you stand or walk. Some people may not develop any symptoms. How is this diagnosed? This condition is diagnosed with:  A medical history and physical exam.  Tests, such as: ? Blood tests. These are done to check how well your blood clots. ? Ultrasound. This is done to check for clots. ? Venogram. For this test, contrast dye is injected into a vein and X-rays are taken to check for any clots. How is this treated? Treatment for this condition depends on:  The cause of your  DVT.  Your risk for bleeding or developing more clots.  Any other medical conditions that you have. Treatment may include:  Taking a blood thinner (anticoagulant). This type of medicine prevents clots from forming. It may be taken by mouth, injected under the skin, or injected through an IV (catheter).  Injecting clot-dissolving medicines into the affected vein (catheter-directed thrombolysis).  Having surgery. Surgery may be done to: ? Remove the clot. ? Place a filter in a large vein to catch blood clots before they reach the lungs. Some treatments may be continued for up to six months. Follow these instructions at home: If you are taking blood thinners:  Take the medicine exactly as told by your health care  provider. Some blood thinners need to be taken at the same time every day. Do not skip a dose.  Talk with your health care provider before you take any medicines that contain aspirin or NSAIDs. These medicines increase your risk for dangerous bleeding.  Ask your health care provider about foods and drugs that could change the way the medicine works (may interact). Avoid those things if your health care provider tells you to do so.  Blood thinners can cause easy bruising and may make it difficult to stop bleeding. Because of this: ? Be very careful when using knives, scissors, or other sharp objects. ? Use an electric razor instead of a blade. ? Avoid activities that could cause injury or bruising, and follow instructions about how to prevent falls.  Wear a medical alert bracelet or carry a card that lists what medicines you take. General instructions  Take over-the-counter and prescription medicines only as told by your health care provider.  Return to your normal activities as told by your health care provider. Ask your health care provider what activities are safe for you.  Wear compression stockings if recommended by your health care provider.  Keep all follow-up visits as told by your health care provider. This is important. How is this prevented? To lower your risk of developing this condition again:  For 30 or more minutes every day, do an activity that: ? Involves moving your arms and legs. ? Increases your heart rate.  When traveling for longer than four hours: ? Exercise your arms and legs every hour. ? Drink plenty of water. ? Avoid drinking alcohol.  Avoid sitting or lying for a long time without moving your legs.  If you have surgery or you are hospitalized, ask about ways to prevent blood clots. These may include taking frequent walks or using anticoagulants.  Stay at a healthy weight.  If you are a woman who is older than age 33, avoid unnecessary use of medicines  that contain estrogen, such as some birth control pills.  Do not use any products that contain nicotine or tobacco, such as cigarettes and e-cigarettes. This is especially important if you take estrogen medicines. If you need help quitting, ask your health care provider. Contact a health care provider if:  You miss a dose of your blood thinner.  Your menstrual period is heavier than usual.  You have unusual bruising. Get help right away if:  You have: ? New or increased pain, swelling, or redness in an arm or leg. ? Numbness or tingling in an arm or leg. ? Shortness of breath. ? Chest pain. ? A rapid or irregular heartbeat. ? A severe headache or confusion. ? A cut that will not stop bleeding.  There is blood in your vomit, stool, or  urine.  You have a serious fall or accident, or you hit your head.  You feel light-headed or dizzy.  You cough up blood. These symptoms may represent a serious problem that is an emergency. Do not wait to see if the symptoms will go away. Get medical help right away. Call your local emergency services (911 in the [place] Do not drive yourself to the hospital. Summary  Deep vein thrombosis (DVT) is a condition in which a blood clot forms in a deep vein, such as a lower leg, thigh, or arm vein.  Symptoms can include swelling, warmth, pain, and redness in your leg or arm.  This condition may be treated with a blood thinner (anticoagulant medicine), medicine that is injected to dissolve blood clots,compression stockings, or surgery.  If you are prescribed blood thinners, take them exactly as told. This information is not intended to replace advice given to you by your health care provider. Make sure you discuss any questions you have with your health care provider. Document Released: 05/11/2005 Document Revised: 04/23/2017 Document Reviewed: 10/09/2016 Elsevier Patient Education  2020 [employer]

## 2018-11-30 NOTE — Addendum Note (Signed)
Addended by: Beckey Rutter on: 11/30/2018 03:48 PM   Modules accepted: Orders

## 2018-11-30 NOTE — Progress Notes (Signed)
Cardiology Office Note:    Date:  11/30/2018   ID:  Stephen Howell, DOB 1945/06/16, MRN [id number]  PCP:  Heywood BeneScott, Robert B, MD  Cardiologist:  Garwin Brothersajan R Demeco Ducksworth, MD   Referring MD: Heywood BeneScott, Robert B, MD    ASSESSMENT:    1. Coronary artery disease involving native coronary artery of native heart without angina pectoris   2. Mixed hyperlipidemia   3. Pedal edema   4. Thyroid disorder screen   5. Old MI (myocardial infarction)    PLAN:    In order of problems listed above:  1. Coronary artery disease: Secondary prevention stressed.  He appears to be stable from the standpoint.  I am not certain about why he has pedal edema and we will get an echocardiogram fairly quickly to assess this. 2. Also in view of bilateral pitting pedal edema right greater than left, I will get a bilateral DVT study.  He will have fasting blood work in the morning including Chem-7 CBC TSH liver lipid check and a BMP.  We will also obtain a CPK.  He will be seen in follow-up appointment in 2 weeks or earlier if he has any concerns.  He knows to go to the nearest emergency room for any concerning symptoms.   Medication Adjustments/Labs and Tests Ordered: Current medicines are reviewed at length with the patient today.  Concerns regarding medicines are outlined above.  Orders Placed This Encounter  Procedures  . Basic Metabolic Panel (BMET)  . CBC  . Pro b natriuretic peptide (BNP)  . Hepatic function panel  . TSH  . Lipid Profile  . ECHOCARDIOGRAM COMPLETE   No orders of the defined types were placed in this encounter.    Chief Complaint  Patient presents with  . Foot Swelling     History of Present Illness:    Stephen Howell is a 73 y.o. male.  The patient has past medical history of coronary artery disease.  He mentions to me that in the past 2 to 1/2 weeks he has been having pedal edema.  He mentions to me that this is new for him.  No orthopnea or PND.  He wants to be evaluated for this.  At  the time of my evaluation, the patient is alert awake oriented and in no distress. Past Medical History:  Diagnosis Date  . CAD (coronary artery disease) 11/20/2014  . Dizziness 02/22/2017  . Dyslipidemia 11/20/2014  . Hyperlipidemia 02/19/2017  . Left ventricular dysfunction 02/22/2017  . Muscular dystrophy (HCC) 02/22/2017  . 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 Sleep apnea 05/11/2017    Past Surgical History:  Procedure Laterality Date  . BACK SURGERY    . HERNIA REPAIR      Current Medications: Current Meds  Medication Sig  . aspirin EC 81 MG tablet Take 81 mg by mouth daily.  . Coenzyme Q-10 200 MG CAPS Take 200 mg by mouth daily.  . nitroGLYCERIN (NITROSTAT) 0.4 MG SL tablet Place 0.4 mg under the tongue every 5 (five) minutes as needed.  . pravastatin (PRAVACHOL) 10 MG tablet TAKE ONE (1) TABLET BY MOUTH EVERY DAY     Allergies:   Atorvastatin   Social History   Socioeconomic History  . Marital status: Married    Spouse name: Not on file  . Number of children: Not on file  . Years of education: Not on file  . Highest education level: Not on file  Occupational History  . Not on file  Social Needs  . Financial resource strain: Not on file  . Food insecurity    Worry: Not on file    Inability: Not on file  . Transportation needs    Medical: Not on file    Non-medical: Not on file  Tobacco Use  . Smoking status: Former Research scientist (life sciences)  . Smokeless tobacco: Never Used  Substance and Sexual Activity  . Alcohol use: Not on file  . Drug use: Not on file  . Sexual activity: Not on file  Lifestyle  . Physical activity    Days per week: Not on file    Minutes per session: Not on file  . Stress: Not on file  Relationships  . Social Herbalist on phone: Not on file    Gets together: Not on file    Attends religious service: Not on file    Active member of club or organization: Not on file    Attends meetings of clubs or organizations: Not on file     Relationship status: Not on file  Other Topics Concern  . Not on file  Social History Narrative  . Not on file     Family History: The patient's family history includes Cancer in his mother; Muscular dystrophy in his brother.  ROS:   Please see the history of present illness.    All other systems reviewed and are negative.  EKGs/Labs/Other Studies Reviewed:    The following studies were reviewed today: As mentioned above   Recent Labs: 05/03/2018: TSH 1.310  Recent Lipid Panel    Component Value Date/Time   CHOL 173 10/13/2017 0925   TRIG 102 10/13/2017 0925   HDL 56 10/13/2017 0925   CHOLHDL 3.1 10/13/2017 0925   LDLCALC 97 10/13/2017 0925    Physical Exam:    VS:  BP 122/68 (BP Location: Left Arm, Patient Position: Sitting, Cuff Size: Normal)   Pulse (!) 48   Ht 5\' 7"  (1.702 m)   Wt 160 lb (72.6 kg)   SpO2 97%   BMI 25.06 kg/m     Wt Readings from Last 3 Encounters:  11/30/18 160 lb (72.6 kg)  05/03/18 148 lb (67.1 kg)  10/13/17 146 lb (66.2 kg)     GEN: Patient is in no acute distress HEENT: Normal NECK: No JVD; No carotid bruits LYMPHATICS: No lymphadenopathy CARDIAC: Hear sounds regular, 2/6 systolic murmur at the apex. RESPIRATORY:  Clear to auscultation without rales, wheezing or rhonchi  ABDOMEN: Soft, non-tender, non-distended MUSCULOSKELETAL: Bilateral 2+ pedal edema; No deformity  SKIN: Warm and dry NEUROLOGIC:  Alert and oriented x 3 PSYCHIATRIC:  Normal affect   Signed, Jenean Lindau, MD  11/30/2018 3:15 PM    [facility]

## 2018-12-01 ENCOUNTER — Encounter: Payer: Self-pay | Admitting: Cardiology

## 2018-12-01 DIAGNOSIS — I361 Nonrheumatic tricuspid (valve) insufficiency: Secondary | ICD-10-CM | POA: Diagnosis not present

## 2018-12-01 DIAGNOSIS — R600 Localized edema: Secondary | ICD-10-CM | POA: Diagnosis not present

## 2018-12-01 DIAGNOSIS — I2510 Atherosclerotic heart disease of native coronary artery without angina pectoris: Secondary | ICD-10-CM

## 2018-12-01 DIAGNOSIS — I340 Nonrheumatic mitral (valve) insufficiency: Secondary | ICD-10-CM | POA: Diagnosis not present

## 2018-12-01 NOTE — Addendum Note (Signed)
Addended by: Beckey Rutter on: 12/01/2018 11:55 AM   Modules accepted: Orders

## 2018-12-02 ENCOUNTER — Telehealth: Payer: Self-pay | Admitting: *Deleted

## 2018-12-02 NOTE — Telephone Encounter (Signed)
Telephone call to patient. Informed of lab results and echo done at [facility].

## 2018-12-15 ENCOUNTER — Other Ambulatory Visit (INDEPENDENT_AMBULATORY_CARE_PROVIDER_SITE_OTHER): Payer: Medicare Other

## 2018-12-15 ENCOUNTER — Ambulatory Visit (INDEPENDENT_AMBULATORY_CARE_PROVIDER_SITE_OTHER): Payer: Medicare Other | Admitting: Cardiology

## 2018-12-15 ENCOUNTER — Other Ambulatory Visit: Payer: Self-pay

## 2018-12-15 ENCOUNTER — Encounter: Payer: Self-pay | Admitting: Cardiology

## 2018-12-15 VITALS — BP 116/60 | HR 55 | Ht 67.0 in | Wt 160.0 lb

## 2018-12-15 DIAGNOSIS — I2510 Atherosclerotic heart disease of native coronary artery without angina pectoris: Secondary | ICD-10-CM

## 2018-12-15 DIAGNOSIS — I440 Atrioventricular block, first degree: Secondary | ICD-10-CM

## 2018-12-15 DIAGNOSIS — G7100 Muscular dystrophy, unspecified: Secondary | ICD-10-CM | POA: Diagnosis not present

## 2018-12-15 DIAGNOSIS — E782 Mixed hyperlipidemia: Secondary | ICD-10-CM

## 2018-12-15 HISTORY — DX: Atrioventricular block, first degree: I44.0

## 2018-12-15 NOTE — Patient Instructions (Signed)

## 2018-12-15 NOTE — Progress Notes (Signed)
Cardiology Office Note:    Date:  12/15/2018   ID:  Stephen Howell, DOB June 16, 1945, MRN [id number]  PCP:  Patient, No Pcp Per  Cardiologist:  Garwin Brothersajan R Lamarco Gudiel, MD   Referring MD: Heywood BeneScott, Robert B, MD    ASSESSMENT:    1. Coronary artery disease involving native coronary artery of native heart without angina pectoris   2. Mixed hyperlipidemia   3. Muscular dystrophy (HCC)   4. First degree AV block    PLAN:    In order of problems listed above:  1. Bradycardia arrhythmias: Today's EKG reveals sinus rhythm with first-degree AV block with left anterior fascicular block.  Heart rate is 55.  He mentions to me that he feels much better.  His TSH is within normal limits.  We will do a 2-week ZIO monitoring to assess if bradycardia is giving him any symptoms.  Again patient tells me he is much better. 2. Essential hypertension: Blood pressures. 3. Coronary artery disease: Diet was discussed.  He is asymptomatic from the standpoint.  Secondary prevention stressed. 4. 3 months follow-up or earlier if he has any concerns.   Medication Adjustments/Labs and Tests Ordered: Current medicines are reviewed at length with the patient today.  Concerns regarding medicines are outlined above.  No orders of the defined types were placed in this encounter.  No orders of the defined types were placed in this encounter.    No chief complaint on file.    History of Present Illness:    Stephen Howell is a 73 y.o. male.  Patient was evaluated by me for significant fatigue.  He mentions to me that he feels much better.  He underwent echocardiogram testing and also DVT study.  DVT was negative I have tried to get the records on the echocardiogram I do not remember it of the top of my head.  His blood work was fine including TSH.  He was found to be significantly bradycardic.  Today he feels much better he says.  No chest pain orthopnea or PND.  At the time of my evaluation, the patient is alert awake  oriented and in no distress.  Past Medical History:  Diagnosis Date   CAD (coronary artery disease) 11/20/2014   Dizziness 02/22/2017   Dyslipidemia 11/20/2014   Hyperlipidemia 02/19/2017   Left ventricular dysfunction 02/22/2017   Muscular dystrophy (HCC) 02/22/2017   Old MI (myocardial infarction) 02/19/2017   Sleep apnea 05/11/2017    Past Surgical History:  Procedure Laterality Date   BACK SURGERY     HERNIA REPAIR      Current Medications: Current Meds  Medication Sig   aspirin EC 81 MG tablet Take 81 mg by mouth daily.   Coenzyme Q-10 200 MG CAPS Take 200 mg by mouth daily.   nitroGLYCERIN (NITROSTAT) 0.4 MG SL tablet Place 0.4 mg under the tongue every 5 (five) minutes as needed.   pravastatin (PRAVACHOL) 10 MG tablet TAKE ONE (1) TABLET BY MOUTH EVERY DAY     Allergies:   Atorvastatin   Social History   Socioeconomic History   Marital status: Married    Spouse name: Not on file   Number of children: Not on file   Years of education: Not on file   Highest education level: Not on file  Occupational History   Not on file  Social Needs   Financial resource strain: Not on file   Food insecurity    Worry: Not on file    Inability:  Not on file   Transportation needs    Medical: Not on file    Non-medical: Not on file  Tobacco Use   Smoking status: Former Smoker   Smokeless tobacco: Never Used  Substance and Sexual Activity   Alcohol use: Not on file   Drug use: Not on file   Sexual activity: Not on file  Lifestyle   Physical activity    Days per week: Not on file    Minutes per session: Not on file   Stress: Not on file  Relationships   Social connections    Talks on phone: Not on file    Gets together: Not on file    Attends religious service: Not on file    Active member of club or organization: Not on file    Attends meetings of clubs or organizations: Not on file    Relationship status: Not on file  Other Topics Concern     Not on file  Social History Narrative   Not on file     Family History: The patient's family history includes Cancer in his mother; Muscular dystrophy in his brother.  ROS:   Please see the history of present illness.    All other systems reviewed and are negative.  EKGs/Labs/Other Studies Reviewed:    The following studies were reviewed today: As mentioned above lab work was reviewed from [facility].  We will trend to receive and obtain records of echocardiogram and DVT study.   Recent Labs: 05/03/2018: TSH 1.310  Recent Lipid Panel    Component Value Date/Time   CHOL 173 10/13/2017 0925   TRIG 102 10/13/2017 0925   HDL 56 10/13/2017 0925   CHOLHDL 3.1 10/13/2017 0925   LDLCALC 97 10/13/2017 0925    Physical Exam:    VS:  BP 116/60 (BP Location: Left Arm, Patient Position: Sitting, Cuff Size: Normal)    Pulse (!) 55    Ht 5\' 7"  (1.702 m)    Wt 160 lb (72.6 kg)    SpO2 96%    BMI 25.06 kg/m     Wt Readings from Last 3 Encounters:  12/15/18 160 lb (72.6 kg)  11/30/18 160 lb (72.6 kg)  05/03/18 148 lb (67.1 kg)     GEN: Patient is in no acute distress HEENT: Normal NECK: No JVD; No carotid bruits LYMPHATICS: No lymphadenopathy CARDIAC: Hear sounds regular, 2/6 systolic murmur at the apex. RESPIRATORY:  Clear to auscultation without rales, wheezing or rhonchi  ABDOMEN: Soft, non-tender, non-distended MUSCULOSKELETAL:  No edema; No deformity  SKIN: Warm and dry NEUROLOGIC:  Alert and oriented x 3 PSYCHIATRIC:  Normal affect   Signed, Jenean Lindau, MD  12/15/2018 10:52 AM    [place]

## 2018-12-28 DIAGNOSIS — I519 Heart disease, unspecified: Secondary | ICD-10-CM

## 2019-01-04 ENCOUNTER — Telehealth: Payer: Self-pay | Admitting: Physician Assistant

## 2019-01-04 DIAGNOSIS — R001 Bradycardia, unspecified: Secondary | ICD-10-CM | POA: Diagnosis not present

## 2019-01-04 NOTE — Telephone Encounter (Signed)
Patient by Joslyn of [place] [phone]) regarding verbal report on 2-week Zio patch monitor that was turned in after 8/5.   Per verbal report, patient had 2 significant episode during the 2-week. First episode happened on 7/30 at 8:58 AM.  Patient had a 3-second pause followed by at 3.1-second pause.  Second episode occurred on 8/3 at 11:57 PM.  Patient was bradycardic with heart rate of 26 bpm.  This persisted for about 60 seconds.  It is unclear if patient was asleep at the time.  Full report has been sent to Dr. Julien Nordmann office for final review.  Note, patient is not on any AV nodal blocking agent.

## 2019-01-05 NOTE — Telephone Encounter (Signed)
Please call them and find out where I can get these tracings for review.

## 2019-01-09 NOTE — Telephone Encounter (Signed)
Information printed from irhythm site and given to Dr. Docia Furl for review

## 2019-01-12 ENCOUNTER — Telehealth: Payer: Self-pay

## 2019-01-12 DIAGNOSIS — I440 Atrioventricular block, first degree: Secondary | ICD-10-CM

## 2019-01-12 NOTE — Telephone Encounter (Signed)
Patient informed of results and ok with referral. Urgent request placed and S.Price cc'd on message. No further questions at this time.

## 2019-01-12 NOTE — Telephone Encounter (Signed)
-----   Message from Jenean Lindau, MD sent at 01/09/2019  2:42 PM EDT ----- Inform pt of report and get EP referral in next 1-2 weeks Jenean Lindau, MD 01/09/2019 2:41 PM

## 2019-01-17 ENCOUNTER — Institutional Professional Consult (permissible substitution): Payer: Medicare Other | Admitting: Cardiology

## 2019-01-17 NOTE — Progress Notes (Deleted)
Electrophysiology Office Note   Date:  01/17/2019   ID:  Stephen Howell, DOB [birth year], MRN [id number]  PCP:  Patient, No Pcp Per  Cardiologist:  Revankar Primary Electrophysiologist:  Pablo Mathurin Jorja LoaMartin Zyra Parrillo, MD    No chief complaint on file.    History of Present Illness: Stephen Howell is a 73 y.o. male who is being seen today for the evaluation of pauses at the request of Glean HessRajan Revankar. Presenting today for electrophysiology evaluation.   He has a history of coronary artery disease, hyperlipidemia, muscular dystrophy, and first-degree AV block.  He had been having issues with fatigue.  He was found to be significantly bradycardic.  He wore a cardiac monitor which showed bradycardia with Mobitz 1 AV block.  His average heart rate was 45 bpm.  Today, he denies*** symptoms of palpitations, chest pain, shortness of breath, orthopnea, PND, lower extremity edema, claudication, dizziness, presyncope, syncope, bleeding, or neurologic sequela. The patient is tolerating medications without difficulties.    Past Medical History:  Diagnosis Date  . CAD (coronary artery disease) 11/20/2014  . Dizziness 02/22/2017  . Dyslipidemia 11/20/2014  . Hyperlipidemia 02/19/2017  . Left ventricular dysfunction 02/22/2017  . Muscular dystrophy (HCC) 02/22/2017  . 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 Sleep apnea 05/11/2017   Past Surgical History:  Procedure Laterality Date  . BACK SURGERY    . HERNIA REPAIR       Current Outpatient Medications  Medication Sig Dispense Refill  . aspirin EC 81 MG tablet Take 81 mg by mouth daily.    . Coenzyme Q-10 200 MG CAPS Take 200 mg by mouth daily.    . nitroGLYCERIN (NITROSTAT) 0.4 MG SL tablet Place 0.4 mg under the tongue every 5 (five) minutes as needed.    . pravastatin (PRAVACHOL) 10 MG tablet TAKE ONE (1) TABLET BY MOUTH EVERY DAY 90 tablet 1   No current facility-administered medications for this visit.     Allergies:   Atorvastatin    Social History:  The patient  reports that he has quit smoking. He has never used smokeless tobacco.   Family History:  The patient's family history includes Cancer in his mother; Muscular dystrophy in his brother.    ROS:  Please see the history of present illness.   Otherwise, review of systems is positive for ***.   All other systems are reviewed and negative.    PHYSICAL EXAM: VS:  There were no vitals taken for this visit. , BMI There is no height or weight on file to calculate BMI. GEN: Well nourished, well developed, in no acute distress  HEENT: normal  Neck: no JVD, carotid bruits, or masses Cardiac: ***RRR; no murmurs, rubs, or gallops,no edema  Respiratory:  clear to auscultation bilaterally, normal work of breathing GI: soft, nontender, nondistended, + BS MS: no deformity or atrophy  Skin: warm and dry Neuro:  Strength and sensation are intact Psych: euthymic mood, full affect  EKG:  EKG {ACTION; IS/IS [id number]} ordered today. Personal review of the ekg ordered shows ***  *** Device interrogation is reviewed today in detail.  See PaceArt for details.   Recent Labs: 05/03/2018: TSH 1.310    Lipid Panel     Component Value Date/Time   CHOL 173 10/13/2017 0925   TRIG 102 10/13/2017 0925   HDL 56 10/13/2017 0925   CHOLHDL 3.1 10/13/2017 0925   LDLCALC 97 10/13/2017 0925     Wt Readings from Last 3 Encounters:  12/15/18 160  lb (72.6 kg)  11/30/18 160 lb (72.6 kg)  05/03/18 148 lb (67.1 kg)      Other studies Reviewed: Additional studies/ records that were reviewed today include: Cardiac monitor 01/09/19  Review of the above records today demonstrates:  Baseline rhythm: sinus Minimum heart rate: 36BPM.  Average heart rate: 45BPM. Maximal heart rate 91BPM. Conduction abnormality: multiple pauses, longest 3.4 seconds. Marked bradycardia and Mobitz I block noted   ASSESSMENT AND PLAN:  1.  Coronary artery disease: Status post LAD stent.  No current  chest pain.  Continue with current management.  2.  Mobitz 1 AV block: Found on 2-week ZIO monitor.***    Current medicines are reviewed at length with the patient today.   The patient {ACTIONS; HAS/DOES NOT HAVE:19233} concerns regarding his medicines.  The following changes were made today:  {NONE DEFAULTED:18576::"none"}  Labs/ tests ordered today include: *** No orders of the defined types were placed in this encounter.    Disposition:   FU with Keland Peyton {gen number [id number]} {Days to years:10300}  Signed, Yoland Scherr Meredith Leeds, MD  01/17/2019 8:13 AM     [facility] [address] [place] [address] [phone] (office) [phone] (fax)

## 2019-01-23 ENCOUNTER — Other Ambulatory Visit: Payer: Self-pay

## 2019-01-23 ENCOUNTER — Ambulatory Visit (INDEPENDENT_AMBULATORY_CARE_PROVIDER_SITE_OTHER): Payer: Medicare Other | Admitting: Cardiology

## 2019-01-23 ENCOUNTER — Encounter: Payer: Self-pay | Admitting: Cardiology

## 2019-01-23 VITALS — BP 128/74 | HR 48 | Ht 67.0 in | Wt 159.2 lb

## 2019-01-23 DIAGNOSIS — Z01812 Encounter for preprocedural laboratory examination: Secondary | ICD-10-CM

## 2019-01-23 DIAGNOSIS — I441 Atrioventricular block, second degree: Secondary | ICD-10-CM

## 2019-01-23 NOTE — Patient Instructions (Addendum)
Medication Instructions:  Your physician recommends that you continue on your current medications as directed. Please refer to the Current Medication list given to you today.     * If you need a refill on your cardiac medications before your next appointment, please call your pharmacy. *   Labwork: Pre procedure labs today: BMET & CBC * Will notify you of abnormal results, otherwise continue current treatment plan.*   Testing/Procedures: Your physician has recommended that you have a pacemaker inserted. A pacemaker is a small device that is placed under the skin of your chest or abdomen to help control abnormal heart rhythms. This device uses electrical pulses to prompt the heart to beat at a normal rate. Pacemakers are used to treat heart rhythms that are too slow. Wire (leads) are attached to the pacemaker that goes into the chambers of you heart. This is done in the hospital and usually requires and overnight stay. Please follow the instructions below, located under the special instructions section.   Follow-Up: Move your echocardiogram appointment to next available.  Once scheduled the nurse will call you to arrange pacemaker implant date.  Your physician recommends that you schedule a wound check appointment 10-14 days, after your procedure on 02/17/19, with the device clinic.  Your physician recommends that you schedule a follow up appointment in 91 days, after your procedure on 02/17/19, with Dr. Elberta Fortisamnitz.  * Please note that any paperwork needing to be filled out by the provider will need to be addressed at the front desk prior to seeing the provider.  Please note that any FMLA, disability or other documents regarding health condition is subject to a $25.00 charge that must be received prior to completion of paperwork in the form of a money order or check. *  Thank you for choosing [facility]!!   Dory HornSherri Leveon Pelzer, RN [phone]   Any Other Special Instructions Will Be Listed  Below (If Applicable).   Implantable Device Instructions  You are scheduled for: Pacemaker implant on __________ with Dr. Elberta Fortisamnitz.  1.   Pre procedure testing-             A.  LAB WORK--- On 01/23/19 You do not need to be fasting.               B. COVID TEST-- On 02/14/19 @ 2:00 pm - [facility] will go to [facility] ([address], [place]) for your Covid testing.   This is a drive thru test site.  There will be multiple testing areas.  Be sure to share with the first checkpoint that you are there for pre-procedure/surgery testing. This will put you into the right (yellow) lane that leads to the PAT testing team.   Stay in your car and the nurse team will come to your car to test you.  After you are tested please go home and self quarantine until the day of your procedure.    2. On the day of your procedure 02/17/19 [facility]  [phone]([address]) at 10:00 am.  Bonita QuinYou will go to the main entrance [employer] and enter where the [employer] parking staff are.  You will check in at ADMITTING.  You may have one support person come in to the hospital with you.  They will be asked to wait in the waiting room.   3.   Do not eat or drink after midnight prior to your procedure.   4.   On  the morning of your procedure do NOT take any medication.  5.  The night before your procedure and the morning of your procedure scrub your neck/chest with surgical scrub.  An instruction letter is included with this letter.    5.  Plan for an overnight stay.  If you use your phone frequently bring your phone charger.  When you are discharged you will need someone to drive you home.   6.  You will follow up with the [facility] Device clinic 10-14 days after your procedure. You will follow up with Dr. Elberta Fortis 91 days after your procedure.  These appointments will be made for you.   * If you have ANY questions after you get home, please call the office [phone] and ask for Naimah Yingst RN or send  a MyChart message.   [place] - Preparing For Surgery  Before surgery, you can play an important role. Because skin is not sterile, your skin needs to be as free of germs as possible. You can reduce the number of germs on your skin by washing with CHG (chlorahexidine gluconate) Soap before surgery.  CHG is an antiseptic cleaner which kills germs and bonds with the skin to continue killing germs even after washing.   Please do not use if you have an allergy to CHG or antibacterial soaps.  If your skin becomes reddened/irritated stop using the CHG.   Do not shave (including legs and underarms) for at least 48 hours prior to first CHG shower.  It is OK to shave your face.  Please follow these instructions carefully:  1.  Shower the night before surgery and the morning of surgery with CHG.  2.  If you choose to wash your hair, wash your hair first as usual with your normal shampoo.  3.  After you shampoo, rinse your hair and body thoroughly to remove the shampoo.  4.  Use CHG as you would any other liquid soap.  You can apply CHG directly to the skin and wash gently with a clean washcloth. 5.  Apply the CHG Soap to your body ONLY FROM THE NECK DOWN.  Do not use on open wounds or open sores.  Avoid contact with your eyes, ears, mouth and genitals (private parts).  Wash genitals (private parts) with your normal soap.  6.  Wash thoroughly, paying special attention to the area where your surgery will be performed.  7.  Thoroughly rinse your body with warm water from the neck down.   8.  DO NOT shower/wash with your normal soap after using and rinsing off the CHG soap.  9.  Pat yourself dry with a clean towel.           10.  Wear clean pajamas.           11.  Place clean sheets on your bed the night of your first shower and do not sleep with pets.  Day of Surgery: Do not apply any deodorants/lotions.  Please wear clean clothes to the hospital/surgery center.    Pacemaker Implantation, Adult  Pacemaker implantation is a procedure to place a pacemaker inside your chest. A pacemaker is a small computer that sends electrical signals to the heart and helps your heart beat normally. A pacemaker also stores information about your heart rhythms. You may need pacemaker implantation if you:  Have a slow heartbeat (bradycardia).  Faint (syncope).  Have shortness of breath (dyspnea) due to heart problems.  The pacemaker attaches to your heart  through a wire, called a lead. Sometimes just one lead is needed. Other times, there will be two leads. There are two types of pacemakers:  Transvenous pacemaker. This type is placed under the skin or muscle of your chest. The lead goes through a vein in the chest area to reach the inside of the heart.  Epicardial pacemaker. This type is placed under the skin or muscle of your chest or belly. The lead goes through your chest to the outside of the heart.  Tell a health care provider about:  Any allergies you have.  All medicines you are taking, including vitamins, herbs, eye drops, creams, and over-the-counter medicines.  Any problems you or family members have had with anesthetic medicines.  Any blood or bone disorders you have.  Any surgeries you have had.  Any medical conditions you have.  Whether you are pregnant or may be pregnant. What are the risks? Generally, this is a safe procedure. However, problems may occur, including:  Infection.  Bleeding.  Failure of the pacemaker or the lead.  Collapse of a lung or bleeding into a lung.  Blood clot inside a blood vessel with a lead.  Damage to the heart.  Infection inside the heart (endocarditis).  Allergic reactions to medicines.  What happens before the procedure? Staying hydrated Follow instructions from your health care provider about hydration, which may include:  Up to 2 hours before the procedure - you may continue to drink clear liquids, such as water, clear fruit  juice, black coffee, and plain tea.  Eating and drinking restrictions Follow instructions from your health care provider about eating and drinking, which may include:  8 hours before the procedure - stop eating heavy meals or foods such as meat, fried foods, or fatty foods.  6 hours before the procedure - stop eating light meals or foods, such as toast or cereal.  6 hours before the procedure - stop drinking milk or drinks that contain milk.  2 hours before the procedure - stop drinking clear liquids.  Medicines  Ask your health care provider about: ? Changing or stopping your regular medicines. This is especially important if you are taking diabetes medicines or blood thinners. ? Taking medicines such as aspirin and ibuprofen. These medicines can thin your blood. Do not take these medicines before your procedure if your health care provider instructs you not to.  You may be given antibiotic medicine to help prevent infection. General instructions  You will have a heart evaluation. This may include an electrocardiogram (ECG), chest X-ray, and heart imaging (echocardiogram,  or echo) tests.  You will have blood tests.  Do not use any products that contain nicotine or tobacco, such as cigarettes and e-cigarettes. If you need help quitting, ask your health care provider.  Plan to have someone take you home from the hospital or clinic.  If you will be going home right after the procedure, plan to have someone with you for 24 hours.  Ask your health care provider how your surgical site will be marked or identified. What happens during the procedure?  To reduce your risk of infection: ? Your health care team will wash or sanitize their hands. ? Your skin will be washed with soap. ? Hair may be removed from the surgical area.  An IV tube will be inserted into one of your veins.  You will be given one or more of the following: ? A medicine to help you relax (sedative). ? A  medicine  to numb the area (local anesthetic). ? A medicine to make you fall asleep (general anesthetic).  If you are getting a transvenous pacemaker: ? An incision will be made in your upper chest. ? A pocket will be made for the pacemaker. It may be placed under the skin or between layers of muscle. ? The lead will be inserted into a blood vessel that returns to the heart. ? While X-rays are taken by an imaging machine (fluoroscopy), the lead will be advanced through the vein to the inside of your heart. ? The other end of the lead will be tunneled under the skin and attached to the pacemaker.  If you are getting an epicardial pacemaker: ? An incision will be made near your ribs or breastbone (sternum) for the lead. ? The lead will be attached to the outside of your heart. ? Another incision will be made in your chest or upper belly to create a pocket for the pacemaker. ? The free end of the lead will be tunneled under the skin and attached to the pacemaker.  The transvenous or epicardial pacemaker will be tested. Imaging studies may be done to check the lead position.  The incisions will be closed with stitches (sutures), adhesive strips, or skin glue.  Bandages (dressing) will be placed over the incisions. The procedure may vary among health care providers and hospitals. What happens after the procedure?  Your blood pressure, heart rate, breathing rate, and blood oxygen level will be monitored until the medicines you were given have worn off.  You will be given antibiotics and pain medicine.  ECG and chest x-rays will be done.  You will wear a continuous type of ECG (Holter monitor) to check your heart rhythm.  Your health care provider will program the pacemaker.  Do not drive for 24 hours if you received a sedative. This information is not intended to replace advice given to you by your health care provider. Make sure you discuss any questions you have with your health care  provider. Document Released: 05/01/2002 Document Revised: 11/29/2015 Document Reviewed: 10/23/2015 Elsevier Interactive Patient Education  2018 [employer].     Pacemaker Implantation, Adult, Care After This sheet gives you information about how to care for yourself after your procedure. Your health care provider may also give you more specific instructions. If you have problems or questions, contact your health care provider. What can I expect after the procedure? After the procedure, it is common to have:  Mild pain.  Slight bruising.  Some swelling over the incision.  A slight bump over the skin where the device was placed. Sometimes, it is possible to feel the device under the skin. This is normal.  Follow these instructions at home: Medicines  Take over-the-counter and prescription medicines only as told by your health care provider.  If you were prescribed an antibiotic medicine, take it as told by your health care provider. Do not stop taking the antibiotic even if you start to feel better. Wound care  Do not remove the bandage on your chest until directed to do so by your health care provider.  After your bandage is removed, you may see pieces of tape called skin adhesive strips over the area where the cut was made (incision site). Let them fall off on their own.  Check the incision site every day to make sure it is not infected, bleeding, or starting to pull apart.  Do not use lotions or ointments near the incision site  unless directed to do so.  Keep the incision area clean and dry for 2-3 days after the procedure or as directed by your health care provider. It takes several weeks for the incision site to completely heal.  Do not take baths, swim, or use a hot tub for 7-10 days or as otherwise directed by your health care provider. Activity  Do not drive or use heavy machinery while taking prescription pain medicine.  Do not drive for 24 hours if you were given  a medicine to help you relax (sedative).  Check with your health care provider before you start to drive or play sports.  Avoid sudden jerking, pulling, or chopping movements that pull your upper arm far away from your body. Avoid these movements for at least 6 weeks or as long as told by your health care provider.  Do not lift your upper arm above your shoulders for at least 6 weeks or as long as told by your health care provider. This means no tennis, golf, or swimming.  You may go back to work when your health care provider says it is okay. Pacemaker care  You may be shown how to transfer data from your pacemaker through the phone to your health care provider.  Always let all health care providers know about your pacemaker before you have any medical procedures or tests.  Wear a medical ID bracelet or necklace stating that you have a pacemaker. Carry a pacemaker ID card with you at all times.  Your pacemaker battery will last for 5-15 years. Routine checks by your health care provider will let the health care provider know when the battery is starting to run down. The pacemaker will need to be replaced when the battery starts to run down.  Do not use amateur Chief of Staff. Other electrical devices are safe to use, including power tools, lawn mowers, and speakers. If you are unsure of whether something is safe to use, ask your health care provider.  When using your cell phone, hold it to the ear opposite the pacemaker. Do not leave your cell phone in a pocket over the pacemaker.  Avoid places or objects that have a strong electric or magnetic field, including: ? Airport Herbalist. When at the airport, let officials know that you have a pacemaker. ? Power plants. ? Large electrical generators. ? Radiofrequency transmission towers, such as cell phone and radio towers. General instructions  Weigh yourself every day. If you suddenly gain weight, fluid  may be building up in your body.  Keep all follow-up visits as told by your health care provider. This is important. Contact a health care provider if:  You gain weight suddenly.  Your legs or feet swell.  It feels like your heart is fluttering or skipping beats (heart palpitations).  You have chills or a fever.  You have more redness, swelling, or pain around your incisions.  You have more fluid or blood coming from your incisions.  Your incisions feel warm to the touch.  You have pus or a bad smell coming from your incisions. Get help right away if:  You have chest pain.  You have trouble breathing or are short of breath.  You become extremely tired.  You are light-headed or you faint. This information is not intended to replace advice given to you by your health care provider. Make sure you discuss any questions you have with your health care provider. Document Released: 11/28/2004 Document Revised: 02/21/2016  Document Reviewed: 02/21/2016 Elsevier Interactive Patient Education  2018 [employer].    Supplemental Discharge Instructions for  Pacemaker/Defibrillator Patients  ACTIVITY No heavy lifting or vigorous activity with your left/right arm for 6 to 8 weeks.  Do not raise your left/right arm above your head for one week.  Gradually raise your affected arm as drawn below.           __  NO DRIVING for     ; you may begin driving on     .  WOUND CARE - Keep the wound area clean and dry.  Do not get this area wet for one week. No showers for one week; you may shower on     . - The tape/steri-strips on your wound will fall off; do not pull them off.  No bandage is needed on the site.  DO  NOT apply any creams, oils, or ointments to the wound area. - If you notice any drainage or discharge from the wound, any swelling or bruising at the site, or you develop a fever > 101? F after you are discharged home, call the office at once.  SPECIAL INSTRUCTIONS - You are  still able to use cellular telephones; use the ear opposite the side where you have your pacemaker/defibrillator.  Avoid carrying your cellular phone near your device. - When traveling through airports, show security personnel your identification card to avoid being screened in the metal detectors.  Ask the security personnel to use the hand wand. - Avoid arc welding equipment, MRI testing (magnetic resonance imaging), TENS units (transcutaneous nerve stimulators).  Call the office for questions about other devices. - Avoid electrical appliances that are in poor condition or are not properly grounded. - Microwave ovens are safe to be near or to operate.  ADDITIONAL INFORMATION FOR DEFIBRILLATOR PATIENTS SHOULD YOUR DEVICE GO OFF: - If your device goes off ONCE and you feel fine afterward, notify the device clinic nurses. - If your device goes off ONCE and you do not feel well afterward, call 911. - If your device goes off TWICE, call 911. - If your device goes off THREE TIMES IN ONE DAY, call 911.  DO NOT DRIVE YOURSELF OR A FAMILY MEMBER WITH A DEFIBRILLATOR TO THE HOSPITAL-CALL 911.

## 2019-01-23 NOTE — Addendum Note (Signed)
Addended by: Stanton Kidney on: 01/23/2019 03:24 PM   Modules accepted: Orders

## 2019-01-23 NOTE — Progress Notes (Signed)
Electrophysiology Office Note   Date:  01/23/2019   ID:  Stephen MooresDonald E Justiss, DOB 1946-04-24, MRN [id number]  PCP:  Patient, No Pcp Per  Cardiologist:  Revankar Primary Electrophysiologist:  Paytience Bures Jorja LoaMartin Hale Chalfin, MD    No chief complaint on file.    History of Present Illness: Stephen Howell is a 73 y.o. male who is being seen today for the evaluation of bradycardia at the request of Glean HessRajan Revankar. Presenting today for electrophysiology evaluation.  He has history of coronary artery disease, hyperlipidemia, muscular dystrophy, and significant bradycardia.  He wore a cardiac monitor that showed episodes of Mobitz 1 AV block with heart rates in the 30s and 2-1 AV block during waking hours.    Today, he denies symptoms of palpitations, chest pain, shortness of breath, orthopnea, PND, lower extremity edema, claudication, dizziness, presyncope, syncope, bleeding, or neurologic sequela. The patient is tolerating medications without difficulties. He does get weak and fatigued.  It is unclear to him whether or not his fatigue is due to his slow heart rate or his muscular dystrophy.  He says that he is not very active at home.  Past Medical History:  Diagnosis Date  . CAD (coronary artery disease) 11/20/2014  . Dizziness 02/22/2017  . Dyslipidemia 11/20/2014  . Hyperlipidemia 02/19/2017  . Left ventricular dysfunction 02/22/2017  . Muscular dystrophy (HCC) 02/22/2017  . 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 Sleep apnea 05/11/2017   Past Surgical History:  Procedure Laterality Date  . BACK SURGERY    . HERNIA REPAIR       Current Outpatient Medications  Medication Sig Dispense Refill  . aspirin EC 81 MG tablet Take 81 mg by mouth daily.    . Coenzyme Q-10 200 MG CAPS Take 200 mg by mouth daily.    . nitroGLYCERIN (NITROSTAT) 0.4 MG SL tablet Place 0.4 mg under the tongue every 5 (five) minutes as needed.    . pravastatin (PRAVACHOL) 10 MG tablet TAKE ONE (1) TABLET BY MOUTH EVERY DAY  90 tablet 1   No current facility-administered medications for this visit.     Allergies:   Atorvastatin   Social History:  The patient  reports that he has quit smoking. He has never used smokeless tobacco.   Family History:  The patient's family history includes Cancer in his mother; Muscular dystrophy in his brother.    ROS:  Please see the history of present illness.   Otherwise, review of systems is positive for none.   All other systems are reviewed and negative.    PHYSICAL EXAM: VS:  BP 128/74   Pulse (!) 48   Ht 5\' 7"  (1.702 m)   Wt 159 lb 3.2 oz (72.2 kg)   SpO2 98%   BMI 24.93 kg/m  , BMI Body mass index is 24.93 kg/m. GEN: Well nourished, well developed, in no acute distress  HEENT: normal  Neck: no JVD, carotid bruits, or masses Cardiac: Regular, bradycardic; no murmurs, rubs, or gallops,no edema  Respiratory:  clear to auscultation bilaterally, normal work of breathing GI: soft, nontender, nondistended, + BS MS: no deformity or atrophy  Skin: warm and dry Neuro:  Strength and sensation are intact Psych: euthymic mood, full affect  EKG:  EKG is ordered today. Personal review of the ekg ordered shows sinus rhythm, first-degree AV block, rate 48  Recent Labs: 05/03/2018: TSH 1.310    Lipid Panel     Component Value Date/Time   CHOL 173 10/13/2017 0925  TRIG 102 10/13/2017 0925   HDL 56 10/13/2017 0925   CHOLHDL 3.1 10/13/2017 0925   LDLCALC 97 10/13/2017 0925     Wt Readings from Last 3 Encounters:  01/23/19 159 lb 3.2 oz (72.2 kg)  12/15/18 160 lb (72.6 kg)  11/30/18 160 lb (72.6 kg)      Other studies Reviewed: Additional studies/ records that were reviewed today include: Event monitor 01/09/19 personally reviewed  Review of the above records today demonstrates:  Baseline rhythm: sinus Minimum heart rate: 36BPM.  Average heart rate: 45BPM.  Maximal heart rate 91BPM. Atrial arrhythmia: none significant Ventricular arrhythmia: rare PVC's  Conduction abnormality: multiple pauses, longest 3.4 seconds. Marked bradycardia and Mobitz I block noted Symptoms: none significant   ASSESSMENT AND PLAN:  1.  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  He has had significant bradycardia episodes.  I would like to figure out what type of muscular dystrophy he has, as some are at more high risk of arrhythmias.  He also needs a transthoracic echo.  I Deoni Cosey be in touch with his primary physician to see if there is a diagnosis for his muscular dystrophy.  I have also discussed with his primary cardiologist pacemaker insert.  Risks and benefits were discussed include bleeding, tamponade, infection, pneumothorax.  He understands risks and has agreed to the procedure.  2.  Hypertension:Currently well controlled     Current medicines are reviewed at length with the patient today.   The patient does not have concerns regarding his medicines.  The following changes were made today:  none  Labs/ tests ordered today include:  Orders Placed This Encounter  Procedures  . EKG 12-Lead     Disposition:   FU with Aubry Rankin 3 months  Signed, Daryan Buell Meredith Leeds, MD  01/23/2019 2:58 PM     [facility] [address] [place] [address] [phone] (office) [phone] (fax)

## 2019-01-24 LAB — BASIC METABOLIC PANEL
BUN/Creatinine Ratio: 21 (ref 10–24)
BUN: 17 mg/dL (ref 8–27)
CO2: 27 mmol/L (ref 20–29)
Calcium: 9.3 mg/dL (ref 8.6–10.2)
Chloride: 107 mmol/L — ABNORMAL HIGH (ref 96–106)
Creatinine, Ser: 0.8 mg/dL (ref 0.76–1.27)
GFR calc Af Amer: 101 mL/min/{1.73_m2} (ref >59)
GFR calc non Af Amer: 87 mL/min/{1.73_m2} (ref >59)
Glucose: 91 mg/dL (ref 65–99)
Potassium: 5.6 mmol/L — ABNORMAL HIGH (ref 3.5–5.2)
Sodium: 147 mmol/L — ABNORMAL HIGH (ref 134–144)

## 2019-01-24 LAB — CBC
Hematocrit: 47 % (ref 37.5–51.0)
Hemoglobin: 15.5 g/dL (ref 13.0–17.7)
MCH: 29.8 pg (ref 26.6–33.0)
MCHC: 33 g/dL (ref 31.5–35.7)
MCV: 90 fL (ref 79–97)
Platelets: 189 10*3/uL (ref 150–450)
RBC: 5.2 x10E6/uL (ref 4.14–5.80)
RDW: 13.4 % (ref 11.6–15.4)
WBC: 5.1 10*3/uL (ref 3.4–10.8)

## 2019-01-31 ENCOUNTER — Other Ambulatory Visit: Payer: Self-pay | Admitting: *Deleted

## 2019-01-31 DIAGNOSIS — E875 Hyperkalemia: Secondary | ICD-10-CM

## 2019-02-01 ENCOUNTER — Other Ambulatory Visit: Payer: Self-pay

## 2019-02-01 DIAGNOSIS — E875 Hyperkalemia: Secondary | ICD-10-CM

## 2019-02-06 DIAGNOSIS — E875 Hyperkalemia: Secondary | ICD-10-CM | POA: Diagnosis not present

## 2019-02-07 LAB — BASIC METABOLIC PANEL
BUN/Creatinine Ratio: 23 (ref 10–24)
BUN: 20 mg/dL (ref 8–27)
CO2: 25 mmol/L (ref 20–29)
Calcium: 9.6 mg/dL (ref 8.6–10.2)
Chloride: 104 mmol/L (ref 96–106)
Creatinine, Ser: 0.86 mg/dL (ref 0.76–1.27)
GFR calc Af Amer: 98 mL/min/{1.73_m2} (ref >59)
GFR calc non Af Amer: 85 mL/min/{1.73_m2} (ref >59)
Glucose: 82 mg/dL (ref 65–99)
Potassium: 5 mmol/L (ref 3.5–5.2)
Sodium: 145 mmol/L — ABNORMAL HIGH (ref 134–144)

## 2019-02-14 ENCOUNTER — Other Ambulatory Visit (HOSPITAL_COMMUNITY)
Admission: RE | Admit: 2019-02-14 | Discharge: 2019-02-14 | Disposition: A | Discharge status: Home / Self Care | Payer: Medicare Other | Source: Ambulatory Visit | Attending: Cardiology | Admitting: Cardiology

## 2019-02-14 DIAGNOSIS — Z01812 Encounter for preprocedural laboratory examination: Secondary | ICD-10-CM | POA: Diagnosis not present

## 2019-02-14 DIAGNOSIS — Z20828 Contact with and (suspected) exposure to other viral communicable diseases: Secondary | ICD-10-CM | POA: Insufficient documentation

## 2019-02-15 ENCOUNTER — Telehealth: Payer: Self-pay | Admitting: *Deleted

## 2019-02-15 LAB — NOVEL CORONAVIRUS, NAA (HOSP ORDER, SEND-OUT TO REF LAB; TAT 18-24 HRS): SARS-CoV-2, NAA: NOT DETECTED

## 2019-02-15 NOTE — Telephone Encounter (Signed)
Spoke to pt's wife. Informed schedule change at hospital this Friday. Aware to arrive at 8:00 am  Wife verbalized understanding and agreeable to plan.

## 2019-02-17 ENCOUNTER — Other Ambulatory Visit: Payer: Self-pay

## 2019-02-17 ENCOUNTER — Ambulatory Visit (HOSPITAL_COMMUNITY)
Admission: RE | Disposition: A | Discharge status: Home / Self Care | Payer: Medicare Other | Source: Home / Self Care | Attending: Cardiology

## 2019-02-17 ENCOUNTER — Encounter (HOSPITAL_COMMUNITY): Payer: Self-pay | Admitting: Cardiology

## 2019-02-17 ENCOUNTER — Ambulatory Visit (HOSPITAL_COMMUNITY)
Admission: RE | Admit: 2019-02-17 | Discharge: 2019-02-18 | Disposition: A | Discharge status: Home / Self Care | Payer: Medicare Other | Attending: Cardiology | Admitting: Cardiology

## 2019-02-17 DIAGNOSIS — Z79899 Other long term (current) drug therapy: Secondary | ICD-10-CM | POA: Insufficient documentation

## 2019-02-17 DIAGNOSIS — E785 Hyperlipidemia, unspecified: Secondary | ICD-10-CM | POA: Diagnosis not present

## 2019-02-17 DIAGNOSIS — I441 Atrioventricular block, second degree: Secondary | ICD-10-CM

## 2019-02-17 DIAGNOSIS — I252 Old myocardial infarction: Secondary | ICD-10-CM | POA: Diagnosis not present

## 2019-02-17 DIAGNOSIS — G4730 Sleep apnea, unspecified: Secondary | ICD-10-CM | POA: Insufficient documentation

## 2019-02-17 DIAGNOSIS — Z950 Presence of cardiac pacemaker: Secondary | ICD-10-CM

## 2019-02-17 DIAGNOSIS — I2510 Atherosclerotic heart disease of native coronary artery without angina pectoris: Secondary | ICD-10-CM | POA: Insufficient documentation

## 2019-02-17 DIAGNOSIS — Z87891 Personal history of nicotine dependence: Secondary | ICD-10-CM | POA: Diagnosis not present

## 2019-02-17 DIAGNOSIS — Z7982 Long term (current) use of aspirin: Secondary | ICD-10-CM | POA: Insufficient documentation

## 2019-02-17 DIAGNOSIS — G7100 Muscular dystrophy, unspecified: Secondary | ICD-10-CM | POA: Diagnosis not present

## 2019-02-17 DIAGNOSIS — Z95818 Presence of other cardiac implants and grafts: Secondary | ICD-10-CM

## 2019-02-17 DIAGNOSIS — I10 Essential (primary) hypertension: Secondary | ICD-10-CM | POA: Diagnosis not present

## 2019-02-17 HISTORY — DX: Atrioventricular block, second degree: I44.1

## 2019-02-17 HISTORY — PX: PACEMAKER IMPLANT: EP1218

## 2019-02-17 HISTORY — DX: Presence of cardiac pacemaker: Z95.0

## 2019-02-17 LAB — BASIC METABOLIC PANEL
Anion gap: 8 (ref 5–15)
BUN: 14 mg/dL (ref 8–23)
CO2: 29 mmol/L (ref 22–32)
Calcium: 9 mg/dL (ref 8.9–10.3)
Chloride: 107 mmol/L (ref 98–111)
Creatinine, Ser: 0.89 mg/dL (ref 0.61–1.24)
GFR calc Af Amer: >60 mL/min (ref >60)
GFR calc non Af Amer: >60 mL/min (ref >60)
Glucose, Bld: 87 mg/dL (ref 70–99)
Potassium: 4.5 mmol/L (ref 3.5–5.1)
Sodium: 144 mmol/L (ref 135–145)

## 2019-02-17 LAB — SURGICAL PCR SCREEN
MRSA, PCR: NEGATIVE
Staphylococcus aureus: NEGATIVE

## 2019-02-17 SURGERY — PACEMAKER IMPLANT

## 2019-02-17 MED ORDER — ACETAMINOPHEN 325 MG PO TABS: 325.0000 mg | ORAL_TABLET | ORAL | Status: DC | PRN

## 2019-02-17 MED ORDER — CEFAZOLIN SODIUM-DEXTROSE 1-4 GM/50ML-% IV SOLN
1.0000 g | Freq: Four times a day (QID) | INTRAVENOUS | Status: AC
  Administered 2019-02-17 – 2019-02-18 (×3): 1 g via INTRAVENOUS
  Filled 2019-02-17 (×3): qty 50

## 2019-02-17 MED ORDER — SODIUM CHLORIDE 0.9 % IV SOLN
INTRAVENOUS | Status: AC
  Filled 2019-02-17: qty 2

## 2019-02-17 MED ORDER — SODIUM CHLORIDE 0.9 % IV SOLN
80.0000 mg | INTRAVENOUS | Status: AC
  Administered 2019-02-17: 80 mg

## 2019-02-17 MED ORDER — NITROGLYCERIN 0.4 MG SL SUBL: 0.4000 mg | SUBLINGUAL_TABLET | SUBLINGUAL | Status: DC | PRN

## 2019-02-17 MED ORDER — MIDAZOLAM HCL 5 MG/5ML IJ SOLN
INTRAMUSCULAR | Status: AC
  Filled 2019-02-17: qty 5

## 2019-02-17 MED ORDER — SODIUM CHLORIDE 0.9 % IV SOLN
INTRAVENOUS | Status: DC
  Administered 2019-02-17: 09:00:00 via INTRAVENOUS

## 2019-02-17 MED ORDER — ASPIRIN EC 81 MG PO TBEC
81.0000 mg | DELAYED_RELEASE_TABLET | Freq: Every day | ORAL | Status: DC
  Administered 2019-02-17 – 2019-02-18 (×2): 81 mg via ORAL
  Filled 2019-02-17 (×2): qty 1

## 2019-02-17 MED ORDER — CEFAZOLIN SODIUM-DEXTROSE 2-4 GM/100ML-% IV SOLN
INTRAVENOUS | Status: AC
  Filled 2019-02-17: qty 100

## 2019-02-17 MED ORDER — ONDANSETRON HCL 4 MG/2ML IJ SOLN: 4.0000 mg | Freq: Four times a day (QID) | INTRAMUSCULAR | Status: DC | PRN

## 2019-02-17 MED ORDER — HEPARIN (PORCINE) IN NACL 1000-0.9 UT/500ML-% IV SOLN
INTRAVENOUS | Status: DC | PRN
  Administered 2019-02-17: 500 mL

## 2019-02-17 MED ORDER — COENZYME Q-10 200 MG PO CAPS: 200.0000 mg | ORAL_CAPSULE | Freq: Every day | ORAL | Status: DC

## 2019-02-17 MED ORDER — FENTANYL CITRATE (PF) 100 MCG/2ML IJ SOLN
INTRAMUSCULAR | Status: AC
  Filled 2019-02-17: qty 2

## 2019-02-17 MED ORDER — CEFAZOLIN SODIUM-DEXTROSE 2-4 GM/100ML-% IV SOLN
2.0000 g | INTRAVENOUS | Status: AC
  Administered 2019-02-17: 10:00:00 2 g via INTRAVENOUS

## 2019-02-17 MED ORDER — MUPIROCIN 2 % EX OINT
TOPICAL_OINTMENT | CUTANEOUS | Status: AC
  Administered 2019-02-17: 1 via NASAL
  Filled 2019-02-17: qty 22

## 2019-02-17 MED ORDER — LIDOCAINE HCL 1 % IJ SOLN
INTRAMUSCULAR | Status: AC
  Filled 2019-02-17: qty 60

## 2019-02-17 MED ORDER — HEPARIN (PORCINE) IN NACL 1000-0.9 UT/500ML-% IV SOLN
INTRAVENOUS | Status: AC
  Filled 2019-02-17: qty 500

## 2019-02-17 MED ORDER — MUPIROCIN 2 % EX OINT
1.0000 "application " | TOPICAL_OINTMENT | Freq: Once | CUTANEOUS | Status: DC
  Filled 2019-02-17: qty 22

## 2019-02-17 MED ORDER — CHLORHEXIDINE GLUCONATE 4 % EX LIQD
60.0000 mL | Freq: Once | CUTANEOUS | Status: DC
  Filled 2019-02-17: qty 60

## 2019-02-17 MED ORDER — LIDOCAINE HCL (PF) 1 % IJ SOLN
INTRAMUSCULAR | Status: DC | PRN
  Administered 2019-02-17: 60 mL

## 2019-02-17 SURGICAL SUPPLY — 8 items
CABLE SURGICAL S-101-97-12 (CABLE) ×3 IMPLANT
IPG PACE AZUR XT DR MRI W1DR01 (Pacemaker) IMPLANT
LEAD CAPSURE NOVUS 5076-52CM (Lead) ×2 IMPLANT
LEAD CAPSURE NOVUS 5076-58CM (Lead) ×2 IMPLANT
PACE AZURE XT DR MRI W1DR01 (Pacemaker) ×3 IMPLANT
PAD PRO RADIOLUCENT 2001M-C (PAD) ×3 IMPLANT
SHEATH 7FR PRELUDE SNAP 13 (SHEATH) ×4 IMPLANT
TRAY PACEMAKER INSERTION (PACKS) ×3 IMPLANT

## 2019-02-17 NOTE — Progress Notes (Signed)
Assume care, agree with previous Rn's assessment. Left chest  Surgical site dressing CDI, no swelling noted.will monitor.

## 2019-02-17 NOTE — H&P (Signed)
Stephen Howell has presented today for surgery, with the diagnosis of second degree AV block.  The various methods of treatment have been discussed with the patient and family. After consideration of risks, benefits and other options for treatment, the patient has consented to  Procedure(s): Pacemaker implant as a surgical intervention .  Risks include but not limited to bleeding, tamponade, infection, pneumothorax, among others. The patient's history has been reviewed, patient examined, no change in status, stable for surgery.  I have reviewed the patient's chart and labs.  Questions were answered to the patient's satisfaction.    Stephen BrooklynWill Adarian Bur, MD 02/17/2019 9:34 AM     Electrophysiology Office Note   Date:  02/17/2019   ID:  Stephen Howell, DOB 1945/10/14, MRN [id number]  PCP:  Patient, No Pcp Per  Cardiologist:  Stephen Howell:  Stephen Raben Jorja LoaMartin Zedric Deroy, MD    No chief complaint on file.    History of Present Illness: Stephen Howell is a [age] y.o. male who is being seen today for the evaluation of bradycardia at the request of Stephen Howell. Presenting today for electrophysiology evaluation.  He has history of coronary artery disease, hyperlipidemia, muscular dystrophy, and significant bradycardia.  He wore a cardiac monitor that showed episodes of Mobitz 1 AV block with heart rates in the 30s and 2-1 AV block during waking hours.    Today, he denies symptoms of palpitations, chest pain, shortness of breath, orthopnea, PND, lower extremity edema, claudication, dizziness, presyncope, syncope, bleeding, or neurologic sequela. The patient is tolerating medications without difficulties. He does get weak and fatigued.  It is unclear to him whether or not his fatigue is due to his slow heart rate or his muscular dystrophy.  He says that he is not very active at home.  Past Medical History:  Diagnosis Date  . CAD (coronary artery disease) 11/20/2014  . Dizziness 02/22/2017   . Dyslipidemia 11/20/2014  . Hyperlipidemia 02/19/2017  . Left ventricular dysfunction 02/22/2017  . Muscular dystrophy (HCC) 02/22/2017  . 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 Sleep apnea 05/11/2017   Past Surgical History:  Procedure Laterality Date  . BACK SURGERY    . HERNIA REPAIR       Current Facility-Administered Medications  Medication Dose Route Frequency Provider Last Rate Last Dose  . 0.9 %  sodium chloride infusion   Intravenous Continuous Stephen Lemmingamnitz, Edmar Blankenburg Martin, MD 50 mL/hr at 02/17/19 [phone]    . ceFAZolin (ANCEF) IVPB 2g/100 mL premix  2 g Intravenous On Call Stephen Coventry Daphine DeutscherMartin, MD      . chlorhexidine (HIBICLENS) 4 % liquid 4 application  60 mL Topical Once Stephen Tarman Daphine DeutscherMartin, MD      . gentamicin (GARAMYCIN) 80 mg in sodium chloride 0.9 % 500 mL irrigation  80 mg Irrigation On Call Stephen Luu Daphine DeutscherMartin, MD      . mupirocin ointment (BACTROBAN) 2 % 1 application  1 application Topical Once Stephen Howell, Andree CossWill Martin, MD        Allergies:   Atorvastatin   Social History:  The patient  reports that he has quit smoking. He has never used smokeless tobacco.   Family History:  The patient's family history includes Cancer in his mother; Muscular dystrophy in his brother.    ROS:  Please see the history of present illness.   Otherwise, review of systems is positive for none.   All other systems are reviewed and negative.    PHYSICAL EXAM: VS:  BP (!) 154/66  Pulse (!) 45   Temp 97.8 F (36.6 C) (Skin)   Resp 16   Ht 5\' 7"  (1.702 m)   Wt 72.1 kg   SpO2 99%   BMI 24.90 kg/m  , BMI Body mass index is 24.9 kg/m. GEN: Well nourished, well developed, in no acute distress  HEENT: normal  Neck: no JVD, carotid bruits, or masses Cardiac: Regular, bradycardic; no murmurs, rubs, or gallops,no edema  Respiratory:  clear to auscultation bilaterally, normal work of breathing GI: soft, nontender, nondistended, + BS MS: no deformity or atrophy  Skin: warm and dry Neuro:   Strength and sensation are intact Psych: euthymic mood, full affect  EKG:  EKG is ordered today. Personal review of the ekg ordered shows sinus rhythm, first-degree AV block, rate 48  Recent Labs: 05/03/2018: TSH 1.310 01/23/2019: Hemoglobin 15.5; Platelets 189 02/06/2019: BUN 20; Creatinine, Ser 0.86; Potassium 5.0; Sodium 145    Lipid Panel     Component Value Date/Time   CHOL 173 10/13/2017 0925   TRIG 102 10/13/2017 0925   HDL 56 10/13/2017 0925   CHOLHDL 3.1 10/13/2017 0925   LDLCALC 97 10/13/2017 0925     Wt Readings from Last 3 Encounters:  02/17/19 72.1 kg  01/23/19 72.2 kg  12/15/18 72.6 kg      Other studies Reviewed: Additional studies/ records that were reviewed today include: Event monitor 01/09/19 personally reviewed  Review of the above records today demonstrates:  Baseline rhythm: sinus Minimum heart rate: 36BPM.  Average heart rate: 45BPM.  Maximal heart rate 91BPM. Atrial arrhythmia: none significant Ventricular arrhythmia: rare PVC's Conduction abnormality: multiple pauses, longest 3.4 seconds. Marked bradycardia and Mobitz I block noted Symptoms: none significant   ASSESSMENT AND PLAN:  1.  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  He has had significant bradycardia episodes.  I would like to figure out what type of muscular dystrophy he has, as some are at more high risk of arrhythmias.  He also needs a transthoracic echo.  I Leeland Lovelady be in touch with his primary physician to see if there is a diagnosis for his muscular dystrophy.  I have also discussed with his primary cardiologist pacemaker insert.  Risks and benefits were discussed include bleeding, tamponade, infection, pneumothorax.  He understands risks and has agreed to the procedure.  2.  Hypertension:Currently well controlled     Current medicines are reviewed at length with the patient today.   The patient does not have concerns regarding his medicines.  The following changes were made today:   none  Labs/ tests ordered today include:  Orders Placed This Encounter  Procedures  . Surgical PCR screen  . Basic metabolic panel  . Diet NPO time specified  . Informed consent details: transcribe and obtain patient signature  . Initiate Pre-op Protocol  . Void on call to EP Lab  . Electrode Placement Place arm electrodes on posterior shoulders  . Confirm CBC and BMP (or CMP) results within 7 days for inpatient and 30 days for outpatient:  . Swab Process: 1.  Use the double-swab Venturi Transystem Transport Swab (green writing) to collect the specimen. 2.  Insert the dry culture swabs 1-2 cm into the patient's nostril and rotate the swabs against the inside of the nostril for 3 second...  . Considerations: If a patient has had ENT surgery or has facial trauma with packing in place, coordinate with the physician to obtain the nasal swabs.  Other body sites may not be used  to screen for MRSA.  . If PCR screen results not back preoperatively, apply Mupirocin Calcium 2% (Bactroban) ointment to both nares Label tube with patient name and instructions  . If PCR Screen is Positive for MRSA: Initiate Methicillin Resistant Staphylococcus aureus (MRSA) PCR Positive Standing Orders and notify surgeon of positive result.  . If PCR screen is positive for Staphylococcus Aureus: Initiate Staphylococcus Aureus Positive Standing Orders and notify Surgeon of positive result  . Use clippers to remove hair, entire chest area  . Pre-admission testing diagnosis  . SCRUB WITH chlorhexidine (HIBICLENS) 4 %  . EP PPM/ICD IMPLANT  . Insert peripheral IV     Disposition:   FU with Clydean Posas 3 months  Signed, Rony Ratz Meredith Leeds, MD  02/17/2019 9:34 AM     [facility] [address] [place] [address] [phone] (office) [phone] (fax)

## 2019-02-17 NOTE — Discharge Instructions (Signed)
° ° °  Supplemental Discharge Instructions for  Pacemaker/Defibrillator Patients  Activity No heavy lifting or vigorous activity with your left/right arm for 6 to 8 weeks.  Do not raise your left/right arm above your head for one week.  Gradually raise your affected arm as drawn below.             02/21/2019                 02/22/2019                02/23/2019               02/24/2019 __  NO DRIVING  until cleared to at your wound check visit  WOUND CARE - Keep the wound area clean and dry.  Do not get this area wet, no showers until cleared to at your wound check visit  . - The tape/steri-strips on your wound will fall off; do not pull them off.  No bandage is needed on the site.  DO  NOT apply any creams, oils, or ointments to the wound area. - If you notice any drainage or discharge from the wound, any swelling or bruising at the site, or you develop a fever > 101? F after you are discharged home, call the office at once.  Special Instructions - You are still able to use cellular telephones; use the ear opposite the side where you have your pacemaker/defibrillator.  Avoid carrying your cellular phone near your device. - When traveling through airports, show security personnel your identification card to avoid being screened in the metal detectors.  Ask the security personnel to use the hand wand. - Avoid arc welding equipment, MRI testing (magnetic resonance imaging), TENS units (transcutaneous nerve stimulators).  Call the office for questions about other devices. - Avoid electrical appliances that are in poor condition or are not properly grounded. - Microwave ovens are safe to be near or to operate.

## 2019-02-18 ENCOUNTER — Ambulatory Visit (HOSPITAL_COMMUNITY): Payer: Medicare Other

## 2019-02-18 DIAGNOSIS — I441 Atrioventricular block, second degree: Secondary | ICD-10-CM | POA: Diagnosis not present

## 2019-02-18 DIAGNOSIS — I2510 Atherosclerotic heart disease of native coronary artery without angina pectoris: Secondary | ICD-10-CM | POA: Diagnosis not present

## 2019-02-18 DIAGNOSIS — Z950 Presence of cardiac pacemaker: Secondary | ICD-10-CM | POA: Diagnosis not present

## 2019-02-18 DIAGNOSIS — Z87891 Personal history of nicotine dependence: Secondary | ICD-10-CM | POA: Diagnosis not present

## 2019-02-18 DIAGNOSIS — I10 Essential (primary) hypertension: Secondary | ICD-10-CM | POA: Diagnosis not present

## 2019-02-18 DIAGNOSIS — Z7982 Long term (current) use of aspirin: Secondary | ICD-10-CM | POA: Diagnosis not present

## 2019-02-18 DIAGNOSIS — E785 Hyperlipidemia, unspecified: Secondary | ICD-10-CM | POA: Diagnosis not present

## 2019-02-18 DIAGNOSIS — G4730 Sleep apnea, unspecified: Secondary | ICD-10-CM | POA: Diagnosis not present

## 2019-02-18 DIAGNOSIS — Z79899 Other long term (current) drug therapy: Secondary | ICD-10-CM | POA: Diagnosis not present

## 2019-02-18 DIAGNOSIS — I252 Old myocardial infarction: Secondary | ICD-10-CM | POA: Diagnosis not present

## 2019-02-18 HISTORY — DX: Presence of cardiac pacemaker: Z95.0

## 2019-02-18 MED ORDER — ACETAMINOPHEN 325 MG PO TABS: 325.0000 mg | ORAL_TABLET | ORAL | Status: AC | PRN

## 2019-02-18 NOTE — Discharge Summary (Signed)
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 Atorvastatin Other (See Comments)    Make pt feel "crazy"    Diagnostic Studies/Procedures    Pacemaker implant 9/25/202 _____________   History of Present Illness     73 y/o male with CAD and Muscular Dystrophy, admitted for pacemaker secondary to symptomatic bradycardia.   Hospital Course     73 y.o. male who was referred to Dr Elberta Fortis for the evaluation of bradycardia at the request of Glean Hess Revankar.  The patient has history of coronary artery disease, hyperlipidemia, muscular dystrophy, and significant bradycardia.  He wore a cardiac monitor that showed episodes of Mobitz 1 AV block with heart rates in the 30s and 2-1 AV block during waking hours. He was admitted 02/17/2019 for elective MDT pacemaker implantation.  He tolerated this well and Dr Graciela Husbands feels the patient can be discharged 02/18/2019.  _____________  Discharge Vitals Blood pressure (!) 142/71, pulse 62, temperature 97.8 F (36.6 C), temperature source Oral, resp. rate 18, height 5\' 7"  (1.702 m), weight 68.7 kg, SpO2 99 %.  Filed Weights   02/17/19 0807 02/17/19 1235 02/18/19 0601  Weight: 72.1 kg 70.4 kg 68.7 kg    Labs & Radiologic Studies    CBC No results for input(s): WBC, NEUTROABS, HGB, HCT, MCV, PLT in the last 72 hours. Basic Metabolic Panel Recent Labs    02/20/19 0829  NA 144  K 4.5  CL 107  CO2 29  GLUCOSE 87  BUN 14  CREATININE 0.89  CALCIUM 9.0   Liver Function Tests  No results for input(s): AST, ALT, ALKPHOS, BILITOT, PROT, ALBUMIN in the last 72 hours. No results for input(s): LIPASE, AMYLASE in the last 72 hours. High Sensitivity Troponin:   No results for input(s): TROPONINIHS in the last 720 hours.  BNP Invalid input(s): POCBNP D-Dimer No results for input(s): DDIMER in the last 72 hours. Hemoglobin A1C No results for input(s): HGBA1C in the last 72 hours. Fasting Lipid Panel No results for input(s): CHOL, HDL, LDLCALC, TRIG, CHOLHDL, LDLDIRECT in the last 72 hours. Thyroid Function Tests No results for input(s): TSH, T4TOTAL, T3FREE, THYROIDAB in the last 72 hours.  Invalid input(s): FREET3 _____________  Dg Chest 2 View  Result Date: 02/18/2019 CLINICAL DATA:  Cardiac device in situ. EXAM: CHEST - 2 VIEW COMPARISON:  Radiograph of March 29, 2018. FINDINGS: The heart size and mediastinal contours are within normal limits. Left-sided pacemaker is in good position. No pneumothorax or pleural effusion is noted. Lungs are clear. The visualized skeletal structures are unremarkable. IMPRESSION: Interval placement of left-sided pacemaker with leads in grossly good position. No pneumothorax is noted. Electronically Signed   By: March 31, 2018 M.D.   On: 02/18/2019 08:52   Disposition   Pt is being discharged home today in good condition.  Follow-up Plans & Appointments    Follow-up Information    [facility] Office Follow up.   Specialty: Cardiology Why: 02/28/2019 @ 11:30AM, wound check visit Contact information: [address], [address]  [place]       Constance Haw, MD Follow up.   Specialty: Cardiology Why: you will be called to make a 3 month follow up visit Contact information: [place] [address] [phone]            Discharge Medications   Allergies as of 02/18/2019      Reactions   Atorvastatin Other (See Comments)   Make pt feel  "crazy"      Medication List    TAKE these medications   acetaminophen 325 MG tablet Commonly known as: TYLENOL Take 1-2 tablets (325-650 mg total) by mouth every 4 (four) hours as needed for mild pain.   aspirin EC 81 MG tablet Take 81 mg by mouth daily.   Coenzyme Q-10 200 MG Caps Take 200 mg by mouth daily at 3 pm.   nitroGLYCERIN 0.4 MG SL tablet Commonly known as: NITROSTAT Place 0.4 mg under the tongue every 5 (five) minutes x 3 doses as needed for chest pain.   pravastatin 10 MG tablet Commonly known as: PRAVACHOL TAKE ONE (1) TABLET BY MOUTH EVERY DAY What changed: See the new instructions.        Acute coronary syndrome (MI, NSTEMI, STEMI, etc) this admission?: No.    Outstanding Labs/Studies    Duration of Discharge Encounter   Greater than 30 minutes including physician time.  Angelena Form, PA-C 02/18/2019, 12:55 PM

## 2019-02-18 NOTE — Progress Notes (Signed)
        Patient Name: Stephen Howell      SUBJECTIVE:denies sob or chest pain but his speech is very difficult to understand given his Muscular dystrophy-- trouble with clearing secretions but he says this is normal for him  Past Medical History:  Diagnosis Date  . CAD (coronary artery disease) 11/20/2014  . Dizziness 02/22/2017  . Dyslipidemia 11/20/2014  . Hyperlipidemia 02/19/2017  . Left ventricular dysfunction 02/22/2017  . Muscular dystrophy ([place]) 02/22/2017  . 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 Sleep apnea 05/11/2017    Scheduled Meds:  Scheduled Meds: . aspirin EC  81 mg Oral Daily   Continuous Infusions: acetaminophen, nitroGLYCERIN, ondansetron (ZOFRAN) IV    PHYSICAL EXAM Vitals:   02/17/19 2114 02/18/19 0044 02/18/19 0437 02/18/19 0601  BP: 130/88 130/80 (!) 141/82   Pulse: 61 62 61   Resp: 16 17 18    Temp: 98.6 F (37 C) 97.7 F (36.5 C) 97.8 F (36.6 C)   TempSrc: Oral Oral Oral   SpO2: 94% 95% 97%   Weight:    68.7 kg  Height:       Well developed and cachectic in no acute distress HENT normal frontal bossing Neck supple * Coarse BS ? Upper v lower airway Pocket without hematoma, swelling or tenderness  Regular rate and rhythm,   Abd-soft   No Clubbing cyanosis edema Skin-warm and dry A & Oriented  Lying in bed with difficult to understand speech     TELEMETRY: Reviewed personnally pt in AV pacing:  ECG personally reviewed AV pacing Chest Xray personally reviewed  Good lead position  Intake/Output Summary (Last 24 hours) at 02/18/2019 0800 Last data filed at 02/18/2019 0600 Gross per 24 hour  Intake 560 ml  Output 2375 ml  Net -1815 ml    LABS: Basic Metabolic Panel: Recent Labs  Lab 02/17/19 0829  NA 144  K 4.5  CL 107  CO2 29  GLUCOSE 87  BUN 14  CREATININE 0.89  CALCIUM 9.0    Device Interrogation: normal device function    ASSESSMENT AND PLAN:   Active Problems:   Second degree AV block Muscular dystrophy   Post pacemaker implantation Instructions given     Signed, Virl Axe MD  02/18/2019

## 2019-02-18 NOTE — Progress Notes (Signed)
Patient is given discharge instructions all questions answered, waiting for his wife to come pick him up.

## 2019-02-18 NOTE — Progress Notes (Signed)
Patient is not following safety commands, and is not staying in bed. Low bed is ordered and awaiting for it. Patient is educated about safety but needs reinforcements. His bed alarm is on.

## 2019-02-20 MED FILL — Lidocaine HCl Local Inj 1%: INTRAMUSCULAR | Qty: 60 | Status: AC

## 2019-02-28 ENCOUNTER — Ambulatory Visit (INDEPENDENT_AMBULATORY_CARE_PROVIDER_SITE_OTHER): Payer: Medicare Other | Admitting: *Deleted

## 2019-02-28 ENCOUNTER — Other Ambulatory Visit: Payer: Self-pay

## 2019-02-28 DIAGNOSIS — I441 Atrioventricular block, second degree: Secondary | ICD-10-CM

## 2019-02-28 NOTE — Patient Instructions (Signed)
Call clinic if wound site has increased swelling, drainage,  Redness or if you develop a fever.

## 2019-03-01 LAB — CUP PACEART INCLINIC DEVICE CHECK
Battery Remaining Longevity: 129 mo
Battery Voltage: 3.17 V
Brady Statistic AP VP Percent: 85.8 %
Brady Statistic AP VS Percent: 0 %
Brady Statistic AS VP Percent: 14.19 %
Brady Statistic AS VS Percent: 0.01 %
Brady Statistic RA Percent Paced: 85.78 %
Brady Statistic RV Percent Paced: 99.99 %
Date Time Interrogation Session: 20201006153601
Implantable Lead Implant Date: 20200925
Implantable Lead Implant Date: 20200925
Implantable Lead Location: 753859
Implantable Lead Location: 753860
Implantable Lead Model: 5076
Implantable Lead Model: 5076
Implantable Pulse Generator Implant Date: 20200925
Lead Channel Impedance Value: 418 Ohm
Lead Channel Impedance Value: 475 Ohm
Lead Channel Impedance Value: 570 Ohm
Lead Channel Impedance Value: 589 Ohm
Lead Channel Pacing Threshold Amplitude: 0.75 V
Lead Channel Pacing Threshold Amplitude: 0.75 V
Lead Channel Pacing Threshold Pulse Width: 0.4 ms
Lead Channel Pacing Threshold Pulse Width: 0.4 ms
Lead Channel Sensing Intrinsic Amplitude: 10.375 mV
Lead Channel Sensing Intrinsic Amplitude: 2.75 mV
Lead Channel Setting Pacing Amplitude: 3.5 V
Lead Channel Setting Pacing Amplitude: 3.5 V
Lead Channel Setting Pacing Pulse Width: 0.4 ms
Lead Channel Setting Sensing Sensitivity: 1.2 mV

## 2019-03-01 NOTE — Progress Notes (Signed)
Wound check appointment. Steri-strips removed. Wound without redness or edema. Incision edges approximated, wound well healed. Normal device function. Thresholds, sensing, and impedances consistent with implant measurements. Device programmed at 3.5V/auto capture programmed on for extra safety margin until 3 month visit. Histogram distribution appropriate for patient and level of activity. No mode switches or high ventricular rates noted. Patient educated about wound care, arm mobility, lifting restrictions. ROV in 3 months with implanting physician. enrolled in remote f/u next remote scheduled for 08/23/18.

## 2019-03-22 ENCOUNTER — Ambulatory Visit: Payer: Medicare Other | Admitting: Cardiology

## 2019-03-23 ENCOUNTER — Other Ambulatory Visit: Payer: Self-pay | Admitting: Cardiology

## 2019-04-06 ENCOUNTER — Ambulatory Visit: Payer: Medicare Other | Admitting: Cardiology

## 2019-04-17 ENCOUNTER — Ambulatory Visit: Payer: Medicare Other | Admitting: Cardiology

## 2019-04-24 ENCOUNTER — Encounter: Payer: Self-pay | Admitting: Cardiology

## 2019-04-24 ENCOUNTER — Other Ambulatory Visit: Payer: Self-pay

## 2019-04-24 ENCOUNTER — Ambulatory Visit (INDEPENDENT_AMBULATORY_CARE_PROVIDER_SITE_OTHER): Payer: Medicare Other | Admitting: Cardiology

## 2019-04-24 VITALS — BP 140/84 | HR 84 | Ht 67.0 in | Wt 160.0 lb

## 2019-04-24 DIAGNOSIS — E782 Mixed hyperlipidemia: Secondary | ICD-10-CM

## 2019-04-24 DIAGNOSIS — I2510 Atherosclerotic heart disease of native coronary artery without angina pectoris: Secondary | ICD-10-CM

## 2019-04-24 DIAGNOSIS — Z1329 Encounter for screening for other suspected endocrine disorder: Secondary | ICD-10-CM

## 2019-04-24 DIAGNOSIS — Z950 Presence of cardiac pacemaker: Secondary | ICD-10-CM | POA: Diagnosis not present

## 2019-04-24 NOTE — Patient Instructions (Signed)
Medication Instructions:  Your physician recommends that you continue on your current medications as directed. Please refer to the Current Medication list given to you today.  *If you need a refill on your cardiac medications before your next appointment, please call your pharmacy*  Lab Work: Your physician recommends that you return FASTING one day this week for BMP, CBC, TSH, hepatic and lipid to be drawn  If you have labs (blood work) drawn today and your tests are completely normal, you will receive your results only by: Marland Kitchen MyChart Message (if you have MyChart) OR . A paper copy in the mail If you have any lab test that is abnormal or we need to change your treatment, we will call you to review the results.  Testing/Procedures: NONE  Follow-Up: At [facility], you and your health needs are our priority.  As part of our continuing mission to provide you with exceptional heart care, we have created designated Provider Care Teams.  These Care Teams include your primary Cardiologist (physician) and Advanced Practice Providers (APPs -  Physician Assistants and Nurse Practitioners) who all work together to provide you with the care you need, when you need it.  Your next appointment:   4 month(s)  The format for your next appointment:   In Person  Provider:   Jyl Heinz, MD

## 2019-04-24 NOTE — Addendum Note (Signed)
Addended by: Beckey Rutter on: 04/24/2019 02:38 PM   Modules accepted: Orders

## 2019-04-24 NOTE — Progress Notes (Signed)
Cardiology Office Note:    Date:  04/24/2019   ID:  Stephen Howell, DOB Jan 04, 1946, MRN [id number]  PCP:  Patient, No Pcp Per  Cardiologist:  Jenean Lindau, MD   Referring MD: No ref. provider found    ASSESSMENT:    1. Coronary artery disease involving native coronary artery of native heart without angina pectoris   2. Pacemaker    PLAN:    In order of problems listed above:  1. Coronary artery disease: Secondary prevention stressed with the patient.  Importance of compliance with diet and medication stressed and he vocalized understanding. 2. Mixed dyslipidemia: He will have blood work in the next few days as he will need to come fasting for complete blood work panel. 3. S/p permanent pacing for bradycardia: Stable at this time and followed by electrophysiology colleagues. 4. Patient will be seen in follow-up appointment in 4 months or earlier if the patient has any concerns    Medication Adjustments/Labs and Tests Ordered: Current medicines are reviewed at length with the patient today.  Concerns regarding medicines are outlined above.  No orders of the defined types were placed in this encounter.  No orders of the defined types were placed in this encounter.    Chief Complaint  Patient presents with  . Follow-up     History of Present Illness:    Stephen Howell is a 73 y.o. male.  Patient has past medical history of coronary artery disease, bradycardia and s/p permanent pacing.  He denies any problems at this time and takes care of activities of daily living.  No chest pain orthopnea or PND.  At the time of my evaluation, the patient is alert awake oriented and in no distress.  Past Medical History:  Diagnosis Date  . CAD (coronary artery disease) 11/20/2014  . Dizziness 02/22/2017  . Dyslipidemia 11/20/2014  . Hyperlipidemia 02/19/2017  . Left ventricular dysfunction 02/22/2017  . Muscular dystrophy ([place]) 02/22/2017  . Old MI (myocardial infarction) 02/19/2017   . 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 Sleep apnea 05/11/2017    Past Surgical History:  Procedure Laterality Date  . BACK SURGERY    . HERNIA REPAIR    . PACEMAKER IMPLANT N/A 02/17/2019   Procedure: PACEMAKER IMPLANT;  Surgeon: Constance Haw, MD;  Location: [facility];  Service: Cardiovascular;  Laterality: N/A;    Current Medications: Current Meds  Medication Sig  . acetaminophen (TYLENOL) 325 MG tablet Take 1-2 tablets (325-650 mg total) by mouth every 4 (four) hours as needed for mild pain.  Marland Kitchen aspirin EC 81 MG tablet Take 81 mg by mouth daily.  . Coenzyme Q-10 200 MG CAPS Take 200 mg by mouth daily at 3 pm.   . nitroGLYCERIN (NITROSTAT) 0.4 MG SL tablet Place 0.4 mg under the tongue every 5 (five) minutes x 3 doses as needed for chest pain.   . pravastatin (PRAVACHOL) 10 MG tablet TAKE 1 TABLET BY MOUTH ONCE DAILY.     Allergies:   Atorvastatin   Social History   Socioeconomic History  . Marital status: Married    Spouse name: Not on file  . Number of children: Not on file  . Years of education: Not on file  . Highest education level: Not on file  Occupational History  . Not on file  Social Needs  . Financial resource strain: Not on file  . Food insecurity    Worry: Not on file    Inability:  Not on file  . Transportation needs    Medical: Not on file    Non-medical: Not on file  Tobacco Use  . Smoking status: Former Games developer  . Smokeless tobacco: Never Used  Substance and Sexual Activity  . Alcohol use: Not Currently  . Drug use: Never  . Sexual activity: Not on file  Lifestyle  . Physical activity    Days per week: Not on file    Minutes per session: Not on file  . Stress: Not on file  Relationships  . Social Musician on phone: Not on file    Gets together: Not on file    Attends religious service: Not on file    Active member of club or organization: Not on file    Attends meetings of clubs or organizations:  Not on file    Relationship status: Not on file  Other Topics Concern  . Not on file  Social History Narrative  . Not on file     Family History: The patient's family history includes Cancer in his mother; Muscular dystrophy in his brother.  ROS:   Please see the history of present illness.    All other systems reviewed and are negative.  EKGs/Labs/Other Studies Reviewed:    The following studies were reviewed today: I reviewed records from pacemaker insertion by our electrophysiology colleagues.   Recent Labs: 05/03/2018: TSH 1.310 01/23/2019: Hemoglobin 15.5; Platelets 189 02/17/2019: BUN 14; Creatinine, Ser 0.89; Potassium 4.5; Sodium 144  Recent Lipid Panel    Component Value Date/Time   CHOL 173 10/13/2017 0925   TRIG 102 10/13/2017 0925   HDL 56 10/13/2017 0925   CHOLHDL 3.1 10/13/2017 0925   LDLCALC 97 10/13/2017 0925    Physical Exam:    VS:  BP 140/84 (BP Location: Left Arm, Patient Position: Sitting, Cuff Size: Normal)   Pulse 84   Ht 5\' 7"  (1.702 m)   Wt 160 lb (72.6 kg)   SpO2 97%   BMI 25.06 kg/m     Wt Readings from Last 3 Encounters:  04/24/19 160 lb (72.6 kg)  02/18/19 151 lb 8 oz (68.7 kg)  01/23/19 159 lb 3.2 oz (72.2 kg)     GEN: Patient is in no acute distress HEENT: Normal NECK: No JVD; No carotid bruits LYMPHATICS: No lymphadenopathy CARDIAC: Hear sounds regular, 2/6 systolic murmur at the apex. RESPIRATORY:  Clear to auscultation without rales, wheezing or rhonchi  ABDOMEN: Soft, non-tender, non-distended MUSCULOSKELETAL:  No edema; No deformity  SKIN: Warm and dry NEUROLOGIC:  Alert and oriented x 3 PSYCHIATRIC:  Normal affect   Signed, 01/25/19, MD  04/24/2019 2:26 PM    [facility]

## 2019-04-25 DIAGNOSIS — Z1329 Encounter for screening for other suspected endocrine disorder: Secondary | ICD-10-CM | POA: Diagnosis not present

## 2019-04-25 DIAGNOSIS — Z950 Presence of cardiac pacemaker: Secondary | ICD-10-CM | POA: Diagnosis not present

## 2019-04-25 DIAGNOSIS — I2510 Atherosclerotic heart disease of native coronary artery without angina pectoris: Secondary | ICD-10-CM | POA: Diagnosis not present

## 2019-04-25 DIAGNOSIS — E782 Mixed hyperlipidemia: Secondary | ICD-10-CM | POA: Diagnosis not present

## 2019-04-26 LAB — LIPID PANEL
Chol/HDL Ratio: 3.2 ratio (ref 0.0–5.0)
Cholesterol, Total: 161 mg/dL (ref 100–199)
HDL: 51 mg/dL (ref >39)
LDL Chol Calc (NIH): 84 mg/dL (ref 0–99)
Triglycerides: 147 mg/dL (ref 0–149)
VLDL Cholesterol Cal: 26 mg/dL (ref 5–40)

## 2019-04-26 LAB — CBC
Hematocrit: 46.5 % (ref 37.5–51.0)
Hemoglobin: 15.2 g/dL (ref 13.0–17.7)
MCH: 30.8 pg (ref 26.6–33.0)
MCHC: 32.7 g/dL (ref 31.5–35.7)
MCV: 94 fL (ref 79–97)
Platelets: 200 10*3/uL (ref 150–450)
RBC: 4.93 x10E6/uL (ref 4.14–5.80)
RDW: 13.3 % (ref 11.6–15.4)
WBC: 6.8 10*3/uL (ref 3.4–10.8)

## 2019-04-26 LAB — BASIC METABOLIC PANEL
BUN/Creatinine Ratio: 12 (ref 10–24)
BUN: 10 mg/dL (ref 8–27)
CO2: 28 mmol/L (ref 20–29)
Calcium: 9 mg/dL (ref 8.6–10.2)
Chloride: 103 mmol/L (ref 96–106)
Creatinine, Ser: 0.85 mg/dL (ref 0.76–1.27)
GFR calc Af Amer: 99 mL/min/{1.73_m2} (ref >59)
GFR calc non Af Amer: 85 mL/min/{1.73_m2} (ref >59)
Glucose: 89 mg/dL (ref 65–99)
Potassium: 4.7 mmol/L (ref 3.5–5.2)
Sodium: 143 mmol/L (ref 134–144)

## 2019-04-26 LAB — HEPATIC FUNCTION PANEL
ALT: 15 IU/L (ref 0–44)
AST: 23 IU/L (ref 0–40)
Albumin: 3.9 g/dL (ref 3.7–4.7)
Alkaline Phosphatase: 54 IU/L (ref 39–117)
Bilirubin Total: 0.4 mg/dL (ref 0.0–1.2)
Bilirubin, Direct: 0.11 mg/dL (ref 0.00–0.40)
Total Protein: 6 g/dL (ref 6.0–8.5)

## 2019-04-26 LAB — TSH: TSH: 1.35 u[IU]/mL (ref 0.450–4.500)

## 2019-05-03 ENCOUNTER — Telehealth: Payer: Self-pay

## 2019-05-03 NOTE — Telephone Encounter (Signed)
Results relayed, no PCP at this time. 

## 2019-05-03 NOTE — Telephone Encounter (Signed)
-----   Message from Jenean Lindau, MD sent at 04/26/2019  2:13 PM EST ----- The results of the study is unremarkable. Please inform patient. I will discuss in detail at next appointment. Cc  primary care/referring physician Jenean Lindau, MD 04/26/2019 2:13 PM

## 2019-08-02 ENCOUNTER — Ambulatory Visit: Payer: Medicare Other | Admitting: Cardiology

## 2019-08-02 ENCOUNTER — Encounter (INDEPENDENT_AMBULATORY_CARE_PROVIDER_SITE_OTHER): Payer: Self-pay

## 2019-08-02 ENCOUNTER — Other Ambulatory Visit: Payer: Self-pay

## 2019-08-02 ENCOUNTER — Encounter: Payer: Self-pay | Admitting: Cardiology

## 2019-08-02 VITALS — BP 122/78 | HR 90 | Ht 67.0 in | Wt 160.0 lb

## 2019-08-02 DIAGNOSIS — Z950 Presence of cardiac pacemaker: Secondary | ICD-10-CM | POA: Diagnosis not present

## 2019-08-02 DIAGNOSIS — I2510 Atherosclerotic heart disease of native coronary artery without angina pectoris: Secondary | ICD-10-CM | POA: Diagnosis not present

## 2019-08-02 DIAGNOSIS — Z1329 Encounter for screening for other suspected endocrine disorder: Secondary | ICD-10-CM | POA: Diagnosis not present

## 2019-08-02 DIAGNOSIS — E782 Mixed hyperlipidemia: Secondary | ICD-10-CM

## 2019-08-02 DIAGNOSIS — G7100 Muscular dystrophy, unspecified: Secondary | ICD-10-CM

## 2019-08-02 NOTE — Progress Notes (Signed)
Cardiology Office Note:    Date:  08/02/2019   ID:  Stephen Howell, DOB 1945/08/29, MRN [id number]  PCP:  Patient, No Pcp Per  Cardiologist:  Garwin Brothers, MD   Referring MD: No ref. provider found    ASSESSMENT:    1. Coronary artery disease involving native coronary artery of native heart without angina pectoris   2. Mixed hyperlipidemia   3. Muscular dystrophy (HCC)   4. Pacemaker    PLAN:    In order of problems listed above:  1. Coronary artery disease: Secondary prevention stressed with the patient.  Importance of compliance with diet and medication stressed and he vocalized understanding.  His blood pressure is stable. 2. Mixed dyslipidemia: He is fasting this morning and will have blood work including lipids 3. Permanent pacemaker insertion: Followed by pacemaker colleagues.  Functioning satisfactorily. 4. Patient will be seen in follow-up appointment in 6 months or earlier if the patient has any concerns    Medication Adjustments/Labs and Tests Ordered: Current medicines are reviewed at length with the patient today.  Concerns regarding medicines are outlined above.  No orders of the defined types were placed in this encounter.  No orders of the defined types were placed in this encounter.    Chief Complaint  Patient presents with  . Follow-up    3 Months     History of Present Illness:    Stephen Howell is a 74 y.o. male.  Patient has past medical history of coronary artery disease, bradycardia arrhythmias for which he has undergone pacemaker implantation.  He denies any problems at this time and takes care of activities of daily living.  His wife accompanies him for this visit.  No chest pain orthopnea or PND.  At the time of my evaluation, the patient is alert awake oriented and in no distress.  Past Medical History:  Diagnosis Date  . CAD (coronary artery disease) 11/20/2014  . Dizziness 02/22/2017  . Dyslipidemia 11/20/2014  . Hyperlipidemia  02/19/2017  . Left ventricular dysfunction 02/22/2017  . Muscular dystrophy ([facility]) 02/22/2017  . 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 Sleep apnea 05/11/2017    Past Surgical History:  Procedure Laterality Date  . BACK SURGERY    . HERNIA REPAIR    . PACEMAKER IMPLANT N/A 02/17/2019   Procedure: PACEMAKER IMPLANT;  Surgeon: Regan Lemming, MD;  Location: [facility];  Service: Cardiovascular;  Laterality: N/A;    Current Medications: Current Meds  Medication Sig  . acetaminophen (TYLENOL) 325 MG tablet Take 1-2 tablets (325-650 mg total) by mouth every 4 (four) hours as needed for mild pain.  Marland Kitchen aspirin EC 81 MG tablet Take 81 mg by mouth daily.  . Coenzyme Q-10 200 MG CAPS Take 200 mg by mouth daily at 3 pm.   . nitroGLYCERIN (NITROSTAT) 0.4 MG SL tablet Place 0.4 mg under the tongue every 5 (five) minutes x 3 doses as needed for chest pain.   . pravastatin (PRAVACHOL) 10 MG tablet TAKE 1 TABLET BY MOUTH ONCE DAILY.     Allergies:   Atorvastatin   Social History   Socioeconomic History  . Marital status: Married    Spouse name: Not on file  . Number of children: Not on file  . Years of education: Not on file  . Highest education level: Not on file  Occupational History  . Not on file  Tobacco Use  . Smoking  status: Former Research scientist (life sciences)  . Smokeless tobacco: Never Used  Substance and Sexual Activity  . Alcohol use: Not Currently  . Drug use: Never  . Sexual activity: Not on file  Other Topics Concern  . Not on file  Social History Narrative  . Not on file   Social Determinants of Health   Financial Resource Strain:   . Difficulty of Paying Living Expenses: Not on file  Food Insecurity:   . Worried About Charity fundraiser in the Last Year: Not on file  . Ran Out of Food in the Last Year: Not on file  Transportation Needs:   . Lack of Transportation (Medical): Not on file  . Lack of Transportation  (Non-Medical): Not on file  Physical Activity:   . Days of Exercise per Week: Not on file  . Minutes of Exercise per Session: Not on file  Stress:   . Feeling of Stress : Not on file  Social Connections:   . Frequency of Communication with Friends and Family: Not on file  . Frequency of Social Gatherings with Friends and Family: Not on file  . Attends Religious Services: Not on file  . Active Member of Clubs or Organizations: Not on file  . Attends Archivist Meetings: Not on file  . Marital Status: Not on file     Family History: The patient's family history includes Cancer in his mother; Muscular dystrophy in his brother.  ROS:   Please see the history of present illness.    All other systems reviewed and are negative.  EKGs/Labs/Other Studies Reviewed:    The following studies were reviewed today: EKG reveals dual-chamber paced rhythm with a paced heart rate of 64.   Recent Labs: 04/25/2019: ALT 15; BUN 10; Creatinine, Ser 0.85; Hemoglobin 15.2; Platelets 200; Potassium 4.7; Sodium 143; TSH 1.350  Recent Lipid Panel    Component Value Date/Time   CHOL 161 04/25/2019 0913   TRIG 147 04/25/2019 0913   HDL 51 04/25/2019 0913   CHOLHDL 3.2 04/25/2019 0913   LDLCALC 84 04/25/2019 0913    Physical Exam:    VS:  BP 122/78   Pulse 90   Ht 5\' 7"  (1.702 m)   Wt 160 lb (72.6 kg)   SpO2 97%   BMI 25.06 kg/m     Wt Readings from Last 3 Encounters:  08/02/19 160 lb (72.6 kg)  04/24/19 160 lb (72.6 kg)  02/18/19 151 lb 8 oz (68.7 kg)     GEN: Patient is in no acute distress HEENT: Normal NECK: No JVD; No carotid bruits LYMPHATICS: No lymphadenopathy CARDIAC: Hear sounds regular, 2/6 systolic murmur at the apex. RESPIRATORY:  Clear to auscultation without rales, wheezing or rhonchi  ABDOMEN: Soft, non-tender, non-distended MUSCULOSKELETAL:  No edema; No deformity  SKIN: Warm and dry NEUROLOGIC:  Alert and oriented x 3 PSYCHIATRIC:  Normal affect    Signed, Jenean Lindau, MD  08/02/2019 9:14 AM    [place]

## 2019-08-02 NOTE — Patient Instructions (Signed)
Medication Instructions:  No mediation changes *If you need a refill on your cardiac medications before your next appointment, please call your pharmacy*   Lab Work: You had labs done today. If you have labs (blood work) drawn today and your tests are completely normal, you will receive your results only by: Marland Kitchen MyChart Message (if you have MyChart) OR . A paper copy in the mail If you have any lab test that is abnormal or we need to change your treatment, we will call you to review the results.   Testing/Procedures: You had an EKG today.   Follow-Up: At [facility], you and your health needs are our priority.  As part of our continuing mission to provide you with exceptional heart care, we have created designated Provider Care Teams.  These Care Teams include your primary Cardiologist (physician) and Advanced Practice Providers (APPs -  Physician Assistants and Nurse Practitioners) who all work together to provide you with the care you need, when you need it.  We recommend signing up for the patient portal called "MyChart".  Sign up information is provided on this After Visit Summary.  MyChart is used to connect with patients for Virtual Visits (Telemedicine).  Patients are able to view lab/test results, encounter notes, upcoming appointments, etc.  Non-urgent messages can be sent to your provider as well.   To learn more about what you can do with MyChart, go to [email or web address].    Your next appointment:   6 month(s)  The format for your next appointment:   In Person  Provider:   Belva Crome, MD   Other Instructions NA

## 2019-08-07 LAB — CBC WITH DIFFERENTIAL/PLATELET
Basophils Absolute: 0 10*3/uL (ref 0.0–0.2)
Basos: 1 %
EOS (ABSOLUTE): 0.3 10*3/uL (ref 0.0–0.4)
Eos: 5 %
Hematocrit: 45.9 % (ref 37.5–51.0)
Hemoglobin: 15.4 g/dL (ref 13.0–17.7)
Immature Grans (Abs): 0 10*3/uL (ref 0.0–0.1)
Immature Granulocytes: 1 %
Lymphocytes Absolute: 1.6 10*3/uL (ref 0.7–3.1)
Lymphs: 28 %
MCH: 31 pg (ref 26.6–33.0)
MCHC: 33.6 g/dL (ref 31.5–35.7)
MCV: 92 fL (ref 79–97)
Monocytes Absolute: 0.4 10*3/uL (ref 0.1–0.9)
Monocytes: 8 %
Neutrophils Absolute: 3.3 10*3/uL (ref 1.4–7.0)
Neutrophils: 57 %
Platelets: 201 10*3/uL (ref 150–450)
RBC: 4.97 x10E6/uL (ref 4.14–5.80)
RDW: 13.5 % (ref 11.6–15.4)
WBC: 5.7 10*3/uL (ref 3.4–10.8)

## 2019-08-07 LAB — LIPID PANEL
Chol/HDL Ratio: 3.1 ratio (ref 0.0–5.0)
Cholesterol, Total: 159 mg/dL (ref 100–199)
HDL: 51 mg/dL (ref >39)
LDL Chol Calc (NIH): 80 mg/dL (ref 0–99)
Triglycerides: 161 mg/dL — ABNORMAL HIGH (ref 0–149)
VLDL Cholesterol Cal: 28 mg/dL (ref 5–40)

## 2019-08-07 LAB — HEPATIC FUNCTION PANEL
ALT: 9 IU/L (ref 0–44)
AST: 17 IU/L (ref 0–40)
Albumin: 4.3 g/dL (ref 3.7–4.7)
Alkaline Phosphatase: 48 IU/L (ref 39–117)
Bilirubin Total: 0.4 mg/dL (ref 0.0–1.2)
Bilirubin, Direct: 0.11 mg/dL (ref 0.00–0.40)
Total Protein: 6.2 g/dL (ref 6.0–8.5)

## 2019-08-07 LAB — BASIC METABOLIC PANEL
BUN/Creatinine Ratio: 14 (ref 10–24)
BUN: 13 mg/dL (ref 8–27)
CO2: 27 mmol/L (ref 20–29)
Calcium: 9.1 mg/dL (ref 8.6–10.2)
Chloride: 107 mmol/L — ABNORMAL HIGH (ref 96–106)
Creatinine, Ser: 0.95 mg/dL (ref 0.76–1.27)
GFR calc Af Amer: 90 mL/min/{1.73_m2} (ref >59)
GFR calc non Af Amer: 78 mL/min/{1.73_m2} (ref >59)
Glucose: 82 mg/dL (ref 65–99)
Potassium: 4.6 mmol/L (ref 3.5–5.2)
Sodium: 146 mmol/L — ABNORMAL HIGH (ref 134–144)

## 2019-08-07 LAB — TSH: TSH: 1.73 u[IU]/mL (ref 0.450–4.500)

## 2019-08-23 ENCOUNTER — Ambulatory Visit (INDEPENDENT_AMBULATORY_CARE_PROVIDER_SITE_OTHER): Payer: Medicare Other | Admitting: *Deleted

## 2019-08-23 DIAGNOSIS — I441 Atrioventricular block, second degree: Secondary | ICD-10-CM | POA: Diagnosis not present

## 2019-08-23 LAB — CUP PACEART REMOTE DEVICE CHECK
Battery Remaining Longevity: 141 mo
Battery Voltage: 3.05 V
Brady Statistic AP VP Percent: 96.34 %
Brady Statistic AP VS Percent: 0 %
Brady Statistic AS VP Percent: 3.6 %
Brady Statistic AS VS Percent: 0.06 %
Brady Statistic RA Percent Paced: 96.32 %
Brady Statistic RV Percent Paced: 99.94 %
Date Time Interrogation Session: 20210331005837
Implantable Lead Implant Date: 20200925
Implantable Lead Implant Date: 20200925
Implantable Lead Location: 753859
Implantable Lead Location: 753860
Implantable Lead Model: 5076
Implantable Lead Model: 5076
Implantable Pulse Generator Implant Date: 20200925
Lead Channel Impedance Value: 380 Ohm
Lead Channel Impedance Value: 475 Ohm
Lead Channel Impedance Value: 513 Ohm
Lead Channel Impedance Value: 570 Ohm
Lead Channel Pacing Threshold Amplitude: 0.625 V
Lead Channel Pacing Threshold Amplitude: 0.625 V
Lead Channel Pacing Threshold Pulse Width: 0.4 ms
Lead Channel Pacing Threshold Pulse Width: 0.4 ms
Lead Channel Sensing Intrinsic Amplitude: 13.25 mV
Lead Channel Sensing Intrinsic Amplitude: 13.25 mV
Lead Channel Sensing Intrinsic Amplitude: 3 mV
Lead Channel Sensing Intrinsic Amplitude: 3 mV
Lead Channel Setting Pacing Amplitude: 1.5 V
Lead Channel Setting Pacing Amplitude: 2 V
Lead Channel Setting Pacing Pulse Width: 0.4 ms
Lead Channel Setting Sensing Sensitivity: 1.2 mV

## 2019-08-23 NOTE — Progress Notes (Signed)
PPM Remote  

## 2019-10-11 ENCOUNTER — Other Ambulatory Visit: Payer: Self-pay | Admitting: Cardiology

## 2019-11-13 DIAGNOSIS — H353132 Nonexudative age-related macular degeneration, bilateral, intermediate dry stage: Secondary | ICD-10-CM | POA: Diagnosis not present

## 2019-11-22 ENCOUNTER — Ambulatory Visit (INDEPENDENT_AMBULATORY_CARE_PROVIDER_SITE_OTHER): Payer: Medicare Other | Admitting: *Deleted

## 2019-11-22 DIAGNOSIS — I440 Atrioventricular block, first degree: Secondary | ICD-10-CM

## 2019-11-22 LAB — CUP PACEART REMOTE DEVICE CHECK
Battery Remaining Longevity: 138 mo
Battery Voltage: 3.03 V
Brady Statistic AP VP Percent: 97.73 %
Brady Statistic AP VS Percent: 0.01 %
Brady Statistic AS VP Percent: 2.18 %
Brady Statistic AS VS Percent: 0.09 %
Brady Statistic RA Percent Paced: 97.74 %
Brady Statistic RV Percent Paced: 99.9 %
Date Time Interrogation Session: 20210630025055
Implantable Lead Implant Date: 20200925
Implantable Lead Implant Date: 20200925
Implantable Lead Location: 753859
Implantable Lead Location: 753860
Implantable Lead Model: 5076
Implantable Lead Model: 5076
Implantable Pulse Generator Implant Date: 20200925
Lead Channel Impedance Value: 380 Ohm
Lead Channel Impedance Value: 475 Ohm
Lead Channel Impedance Value: 494 Ohm
Lead Channel Impedance Value: 570 Ohm
Lead Channel Pacing Threshold Amplitude: 0.625 V
Lead Channel Pacing Threshold Amplitude: 0.75 V
Lead Channel Pacing Threshold Pulse Width: 0.4 ms
Lead Channel Pacing Threshold Pulse Width: 0.4 ms
Lead Channel Sensing Intrinsic Amplitude: 13.25 mV
Lead Channel Sensing Intrinsic Amplitude: 13.25 mV
Lead Channel Sensing Intrinsic Amplitude: 2.75 mV
Lead Channel Sensing Intrinsic Amplitude: 2.75 mV
Lead Channel Setting Pacing Amplitude: 1.5 V
Lead Channel Setting Pacing Amplitude: 2 V
Lead Channel Setting Pacing Pulse Width: 0.4 ms
Lead Channel Setting Sensing Sensitivity: 1.2 mV

## 2019-11-23 DIAGNOSIS — H35371 Puckering of macula, right eye: Secondary | ICD-10-CM | POA: Diagnosis not present

## 2019-11-23 DIAGNOSIS — H353131 Nonexudative age-related macular degeneration, bilateral, early dry stage: Secondary | ICD-10-CM | POA: Diagnosis not present

## 2019-11-23 NOTE — Progress Notes (Signed)
Remote pacemaker transmission.   

## 2020-01-31 ENCOUNTER — Encounter: Payer: Self-pay | Admitting: Cardiology

## 2020-01-31 ENCOUNTER — Other Ambulatory Visit: Payer: Self-pay

## 2020-01-31 ENCOUNTER — Ambulatory Visit: Payer: Medicare Other | Admitting: Cardiology

## 2020-01-31 VITALS — BP 124/80 | HR 78 | Ht 67.0 in | Wt 159.8 lb

## 2020-01-31 DIAGNOSIS — I2510 Atherosclerotic heart disease of native coronary artery without angina pectoris: Secondary | ICD-10-CM | POA: Diagnosis not present

## 2020-01-31 DIAGNOSIS — I441 Atrioventricular block, second degree: Secondary | ICD-10-CM

## 2020-01-31 DIAGNOSIS — Z950 Presence of cardiac pacemaker: Secondary | ICD-10-CM

## 2020-01-31 DIAGNOSIS — E785 Hyperlipidemia, unspecified: Secondary | ICD-10-CM

## 2020-01-31 NOTE — Patient Instructions (Signed)
Medication Instructions:  Your physician recommends that you continue on your current medications as directed. Please refer to the Current Medication list given to you today.  *If you need a refill on your cardiac medications before your next appointment, please call your pharmacy*   Lab Work: Your physician recommends that you return for lab work in: bmet/cbc/tsh/lft/lipid  If you have labs (blood work) drawn today and your tests are completely normal, you will receive your results only by: Marland Kitchen MyChart Message (if you have MyChart) OR . A paper copy in the mail If you have any lab test that is abnormal or we need to change your treatment, we will call you to review the results.   Testing/Procedures: None   Follow-Up: At [facility], you and your health needs are our priority.  As part of our continuing mission to provide you with exceptional heart care, we have created designated Provider Care Teams.  These Care Teams include your primary Cardiologist (physician) and Advanced Practice Providers (APPs -  Physician Assistants and Nurse Practitioners) who all work together to provide you with the care you need, when you need it.  We recommend signing up for the patient portal called "MyChart".  Sign up information is provided on this After Visit Summary.  MyChart is used to connect with patients for Virtual Visits (Telemedicine).  Patients are able to view lab/test results, encounter notes, upcoming appointments, etc.  Non-urgent messages can be sent to your provider as well.   To learn more about what you can do with MyChart, go to [email or web address].    Your next appointment:   6 month(s)  The format for your next appointment:   In Person  Provider:   Belva Crome, MD   Other Instructions

## 2020-01-31 NOTE — Addendum Note (Signed)
Addended by: Debbe Bales on: 01/31/2020 09:59 AM   Modules accepted: Orders

## 2020-01-31 NOTE — Progress Notes (Signed)
Cardiology Office Note:    Date:  01/31/2020   ID:  Stephen Howell, DOB [birth year], MRN [id number]  PCP:  Patient, No Pcp Per  Cardiologist:  Garwin Brothers, MD    Referring MD: No ref. provider found    ASSESSMENT:    1. Second degree AV block   2. Coronary artery disease involving native coronary artery of native heart without angina pectoris   3. Presence of permanent cardiac pacemaker   4. Dyslipidemia    PLAN:    In order of problems listed above:  1. Coronary artery disease: Secondary prevention stressed to the patient.  Importance of compliance with diet medication stressed any vocalized understanding 2. Second-degree AV block post pacemaker insertion: Stable at this time.  Managed by our electrophysiology colleagues. 3. Hyperlipidemia: On statin therapy.  Lipids reviewed from last visit.  He will have blood work today including fasting lipids and next all other blood work. 4. Patient will be seen in follow-up appointment in 6 months or earlier if the patient has any concerns    Medication Adjustments/Labs and Tests Ordered: Current medicines are reviewed at length with the patient today.  Concerns regarding medicines are outlined above.  No orders of the defined types were placed in this encounter.  No orders of the defined types were placed in this encounter.    No chief complaint on file.    History of Present Illness:    Stephen Howell is a 74 y.o. male.  Patient has past medical history of coronary artery disease, second-degree AV block post permanent pacemaker sleep apnea and muscular dystrophy.  He denies any problems at this time and takes care of activities of daily living.  No chest pain orthopnea or PND.  At the time of my evaluation, the patient is alert awake oriented and in no distress.  Past Medical History:  Diagnosis Date  . CAD (coronary artery disease) 11/20/2014  . Dizziness 02/22/2017  . Dyslipidemia 11/20/2014  . Hyperlipidemia 02/19/2017    . Left ventricular dysfunction 02/22/2017  . Muscular dystrophy (HCC) 02/22/2017  . 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 Sleep apnea 05/11/2017    Past Surgical History:  Procedure Laterality Date  . BACK SURGERY    . HERNIA REPAIR    . PACEMAKER IMPLANT N/A 02/17/2019   Procedure: PACEMAKER IMPLANT;  Surgeon: Regan Lemming, MD;  Location: [facility];  Service: Cardiovascular;  Laterality: N/A;    Current Medications: Current Meds  Medication Sig  . acetaminophen (TYLENOL) 325 MG tablet Take 1-2 tablets (325-650 mg total) by mouth every 4 (four) hours as needed for mild pain.  Marland Kitchen aspirin EC 81 MG tablet Take 81 mg by mouth daily.  . Coenzyme Q-10 200 MG CAPS Take 200 mg by mouth daily at 3 pm.   . nitroGLYCERIN (NITROSTAT) 0.4 MG SL tablet Place 0.4 mg under the tongue every 5 (five) minutes x 3 doses as needed for chest pain.   . pravastatin (PRAVACHOL) 10 MG tablet TAKE 1 TABLET BY MOUTH ONCE DAILY.     Allergies:   Atorvastatin   Social History   Socioeconomic History  . Marital status: Married    Spouse name: Not on file  . Number of children: Not on file  . Years of education: Not on file  . Highest education level: Not on file  Occupational History  . Not on file  Tobacco Use  . Smoking  status: Former Games developer  . Smokeless tobacco: Never Used  Vaping Use  . Vaping Use: Never used  Substance and Sexual Activity  . Alcohol use: Not Currently  . Drug use: Never  . Sexual activity: Not on file  Other Topics Concern  . Not on file  Social History Narrative  . Not on file   Social Determinants of Health   Financial Resource Strain:   . Difficulty of Paying Living Expenses: Not on file  Food Insecurity:   . Worried About Programme researcher, broadcasting/film/video in the Last Year: Not on file  . Ran Out of Food in the Last Year: Not on file  Transportation Needs:   . Lack of Transportation (Medical): Not on  file  . Lack of Transportation (Non-Medical): Not on file  Physical Activity:   . Days of Exercise per Week: Not on file  . Minutes of Exercise per Session: Not on file  Stress:   . Feeling of Stress : Not on file  Social Connections:   . Frequency of Communication with Friends and Family: Not on file  . Frequency of Social Gatherings with Friends and Family: Not on file  . Attends Religious Services: Not on file  . Active Member of Clubs or Organizations: Not on file  . Attends Banker Meetings: Not on file  . Marital Status: Not on file     Family History: The patient's family history includes Cancer in his mother; Muscular dystrophy in his brother.  ROS:   Please see the history of present illness.    All other systems reviewed and are negative.  EKGs/Labs/Other Studies Reviewed:    The following studies were reviewed today: I discussed my findings with the patient at length.   Recent Labs: 08/02/2019: ALT 9; BUN 13; Creatinine, Ser 0.95; Hemoglobin 15.4; Platelets 201; Potassium 4.6; Sodium 146; TSH 1.730  Recent Lipid Panel    Component Value Date/Time   CHOL 159 08/02/2019 0944   TRIG 161 (H) 08/02/2019 0944   HDL 51 08/02/2019 0944   CHOLHDL 3.1 08/02/2019 0944   LDLCALC 80 08/02/2019 0944    Physical Exam:    VS:  BP 124/80   Pulse 78   Ht 5\' 7"  (1.702 m)   Wt 159 lb 12.8 oz (72.5 kg)   SpO2 96%   BMI 25.03 kg/m     Wt Readings from Last 3 Encounters:  01/31/20 159 lb 12.8 oz (72.5 kg)  08/02/19 160 lb (72.6 kg)  04/24/19 160 lb (72.6 kg)     GEN: Patient is in no acute distress HEENT: Normal NECK: No JVD; No carotid bruits LYMPHATICS: No lymphadenopathy CARDIAC: Hear sounds regular, 2/6 systolic murmur at the apex. RESPIRATORY:  Clear to auscultation without rales, wheezing or rhonchi  ABDOMEN: Soft, non-tender, non-distended MUSCULOSKELETAL:  No edema; No deformity  SKIN: Warm and dry NEUROLOGIC:  Alert and oriented x  3 PSYCHIATRIC:  Normal affect   Signed, 04/26/19, MD  01/31/2020 9:48 AM    [facility]

## 2020-02-01 ENCOUNTER — Telehealth: Payer: Self-pay

## 2020-02-01 DIAGNOSIS — I2510 Atherosclerotic heart disease of native coronary artery without angina pectoris: Secondary | ICD-10-CM

## 2020-02-01 LAB — CBC
Hematocrit: 45.9 % (ref 37.5–51.0)
Hemoglobin: 15.5 g/dL (ref 13.0–17.7)
MCH: 29.6 pg (ref 26.6–33.0)
MCHC: 33.8 g/dL (ref 31.5–35.7)
MCV: 88 fL (ref 79–97)
Platelets: 209 10*3/uL (ref 150–450)
RBC: 5.23 x10E6/uL (ref 4.14–5.80)
RDW: 13.2 % (ref 11.6–15.4)
WBC: 5.4 10*3/uL (ref 3.4–10.8)

## 2020-02-01 LAB — HEPATIC FUNCTION PANEL
ALT: 11 IU/L (ref 0–44)
AST: 13 IU/L (ref 0–40)
Albumin: 4.1 g/dL (ref 3.7–4.7)
Alkaline Phosphatase: 49 IU/L (ref 48–121)
Bilirubin Total: 0.4 mg/dL (ref 0.0–1.2)
Bilirubin, Direct: 0.12 mg/dL (ref 0.00–0.40)
Total Protein: 6.5 g/dL (ref 6.0–8.5)

## 2020-02-01 LAB — BASIC METABOLIC PANEL
BUN/Creatinine Ratio: 8 — ABNORMAL LOW (ref 10–24)
BUN: 8 mg/dL (ref 8–27)
CO2: 27 mmol/L (ref 20–29)
Calcium: 9.4 mg/dL (ref 8.6–10.2)
Chloride: 104 mmol/L (ref 96–106)
Creatinine, Ser: 1.01 mg/dL (ref 0.76–1.27)
GFR calc Af Amer: 83 mL/min/{1.73_m2} (ref >59)
GFR calc non Af Amer: 72 mL/min/{1.73_m2} (ref >59)
Glucose: 90 mg/dL (ref 65–99)
Potassium: 4.9 mmol/L (ref 3.5–5.2)
Sodium: 144 mmol/L (ref 134–144)

## 2020-02-01 LAB — LIPID PANEL
Chol/HDL Ratio: 3.4 ratio (ref 0.0–5.0)
Cholesterol, Total: 178 mg/dL (ref 100–199)
HDL: 52 mg/dL (ref >39)
LDL Chol Calc (NIH): 106 mg/dL — ABNORMAL HIGH (ref 0–99)
Triglycerides: 114 mg/dL (ref 0–149)
VLDL Cholesterol Cal: 20 mg/dL (ref 5–40)

## 2020-02-01 LAB — TSH: TSH: 1.17 u[IU]/mL (ref 0.450–4.500)

## 2020-02-01 NOTE — Telephone Encounter (Signed)
Spoke with patient regarding results and recommendation.  Patient verbalizes understanding and is agreeable to plan of care. Advised patient to call back with any issues or concerns.  

## 2020-02-01 NOTE — Telephone Encounter (Signed)
-----   Message from Garwin Brothers, MD sent at 02/01/2020  8:13 AM EDT ----- He needs to diet better to get LDL less than 80.  Watch diet low in fat and carbohydrates.  Copy primary care doctor.  Repeat in 4 months. Garwin Brothers, MD 02/01/2020 8:12 AM

## 2020-02-21 ENCOUNTER — Ambulatory Visit (INDEPENDENT_AMBULATORY_CARE_PROVIDER_SITE_OTHER): Payer: Medicare Other | Admitting: Emergency Medicine

## 2020-02-21 DIAGNOSIS — I440 Atrioventricular block, first degree: Secondary | ICD-10-CM

## 2020-02-22 LAB — CUP PACEART REMOTE DEVICE CHECK
Battery Remaining Longevity: 134 mo
Battery Voltage: 3.02 V
Brady Statistic AP VP Percent: 98.32 %
Brady Statistic AP VS Percent: 0 %
Brady Statistic AS VP Percent: 1.67 %
Brady Statistic AS VS Percent: 0.01 %
Brady Statistic RA Percent Paced: 98.3 %
Brady Statistic RV Percent Paced: 99.99 %
Date Time Interrogation Session: 20210928220337
Implantable Lead Implant Date: 20200925
Implantable Lead Implant Date: 20200925
Implantable Lead Location: 753859
Implantable Lead Location: 753860
Implantable Lead Model: 5076
Implantable Lead Model: 5076
Implantable Pulse Generator Implant Date: 20200925
Lead Channel Impedance Value: 361 Ohm
Lead Channel Impedance Value: 475 Ohm
Lead Channel Impedance Value: 494 Ohm
Lead Channel Impedance Value: 570 Ohm
Lead Channel Pacing Threshold Amplitude: 0.625 V
Lead Channel Pacing Threshold Amplitude: 0.75 V
Lead Channel Pacing Threshold Pulse Width: 0.4 ms
Lead Channel Pacing Threshold Pulse Width: 0.4 ms
Lead Channel Sensing Intrinsic Amplitude: 14.625 mV
Lead Channel Sensing Intrinsic Amplitude: 14.625 mV
Lead Channel Sensing Intrinsic Amplitude: 2.5 mV
Lead Channel Sensing Intrinsic Amplitude: 2.5 mV
Lead Channel Setting Pacing Amplitude: 1.5 V
Lead Channel Setting Pacing Amplitude: 2 V
Lead Channel Setting Pacing Pulse Width: 0.4 ms
Lead Channel Setting Sensing Sensitivity: 1.2 mV

## 2020-02-23 NOTE — Progress Notes (Signed)
Remote pacemaker transmission.   

## 2020-04-09 IMAGING — DX DG CHEST 2V
3 series · 3 of 3 positions shown · non-contrast
Comparison: Radiograph March 29, 2018.

CLINICAL DATA: Cardiac device in situ.

EXAM:
CHEST - 2 VIEW

[w chest lat]
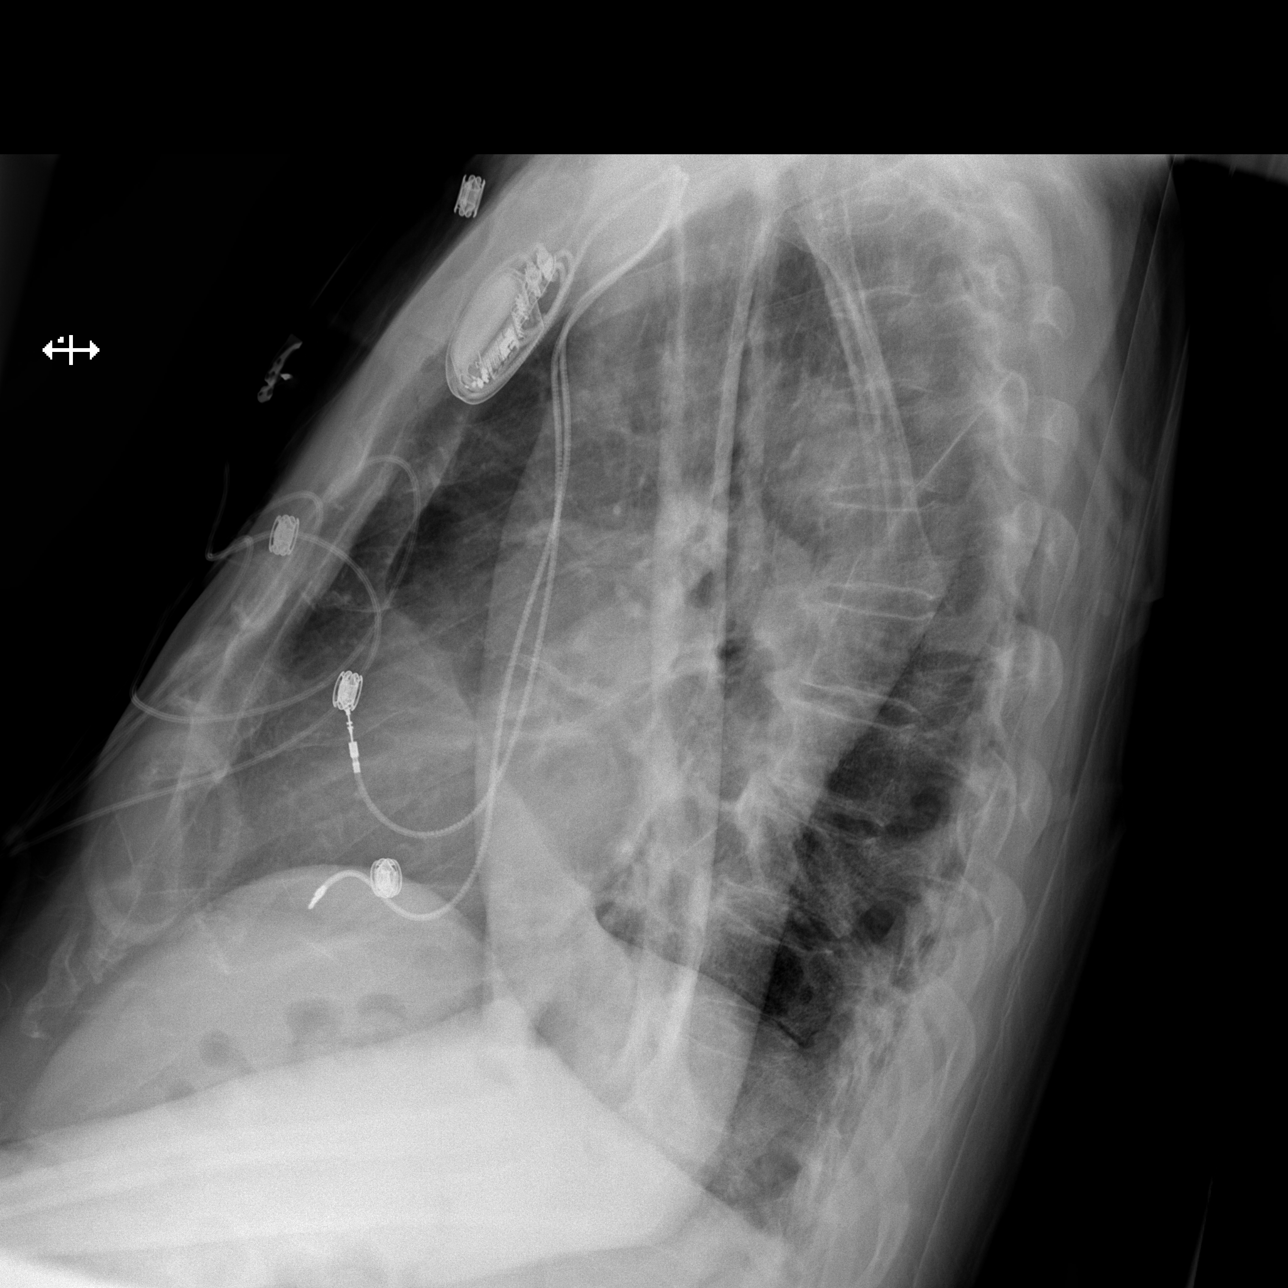

[x chest ap (1 of 2)]
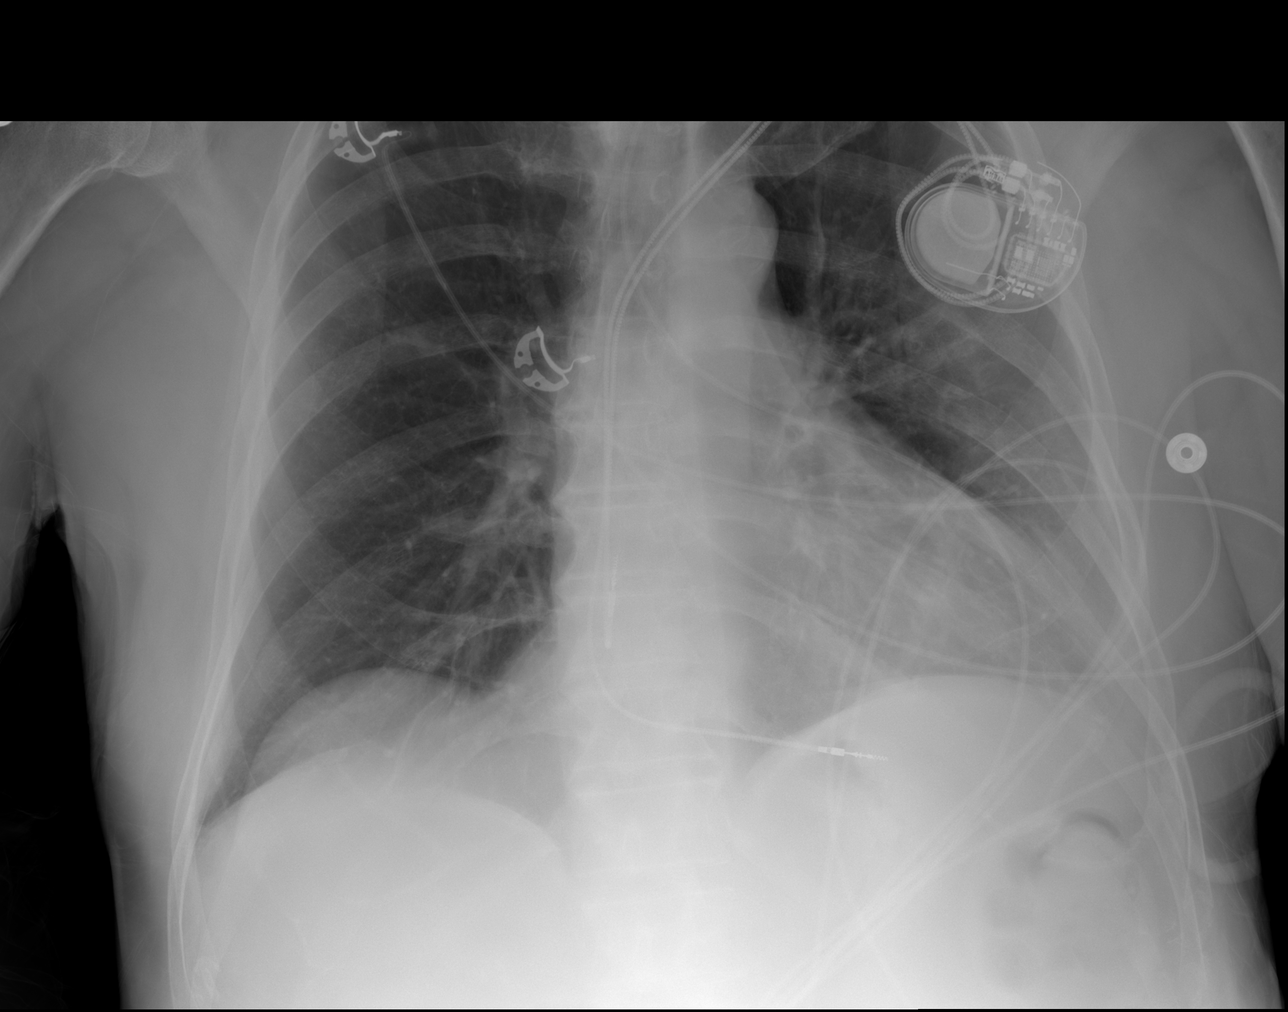

[x chest ap (2 of 2)]
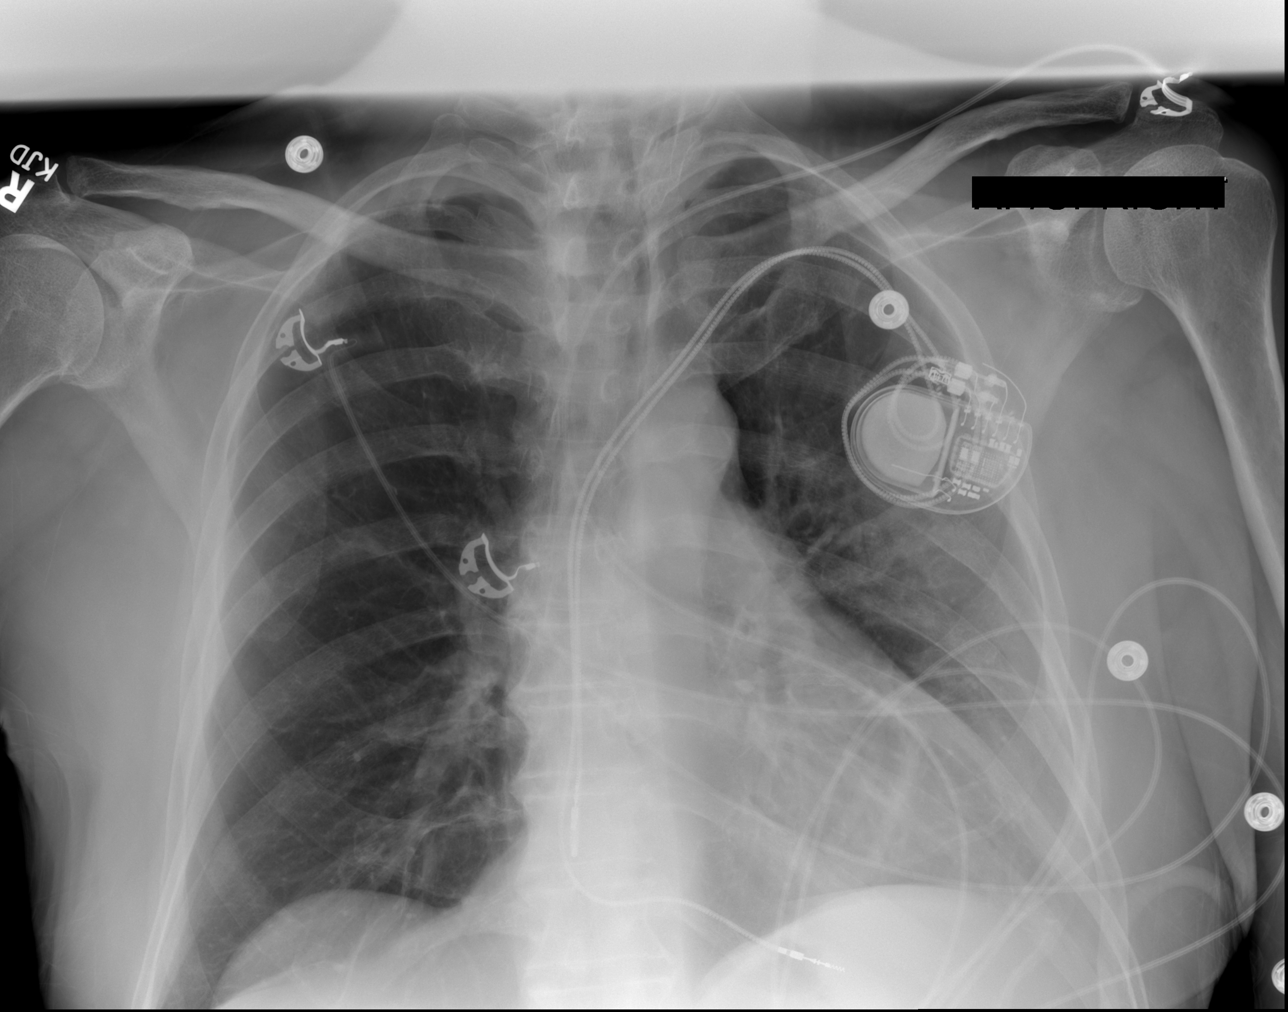

[3 of 3 positions shown; findings below may reference images not displayed]

FINDINGS: The heart size and mediastinal contours are within normal limits.
Left-sided pacemaker is in good position. No pneumothorax or pleural
effusion is noted. Lungs are clear. The visualized skeletal
structures are unremarkable.
IMPRESSION: Interval placement of left-sided pacemaker with leads in grossly
good position. No pneumothorax is noted.

## 2020-05-10 ENCOUNTER — Other Ambulatory Visit: Payer: Self-pay | Admitting: Cardiology

## 2020-05-22 ENCOUNTER — Ambulatory Visit (INDEPENDENT_AMBULATORY_CARE_PROVIDER_SITE_OTHER): Payer: Medicare Other

## 2020-05-22 DIAGNOSIS — I441 Atrioventricular block, second degree: Secondary | ICD-10-CM | POA: Diagnosis not present

## 2020-05-22 LAB — CUP PACEART REMOTE DEVICE CHECK
Battery Remaining Longevity: 132 mo
Battery Voltage: 3.02 V
Brady Statistic AP VP Percent: 98.6 %
Brady Statistic AP VS Percent: 0 %
Brady Statistic AS VP Percent: 1.38 %
Brady Statistic AS VS Percent: 0.02 %
Brady Statistic RA Percent Paced: 98.59 %
Brady Statistic RV Percent Paced: 99.98 %
Date Time Interrogation Session: 20211228205550
Implantable Lead Implant Date: 20200925
Implantable Lead Implant Date: 20200925
Implantable Lead Location: 753859
Implantable Lead Location: 753860
Implantable Lead Model: 5076
Implantable Lead Model: 5076
Implantable Pulse Generator Implant Date: 20200925
Lead Channel Impedance Value: 361 Ohm
Lead Channel Impedance Value: 494 Ohm
Lead Channel Impedance Value: 513 Ohm
Lead Channel Impedance Value: 589 Ohm
Lead Channel Pacing Threshold Amplitude: 0.625 V
Lead Channel Pacing Threshold Amplitude: 0.75 V
Lead Channel Pacing Threshold Pulse Width: 0.4 ms
Lead Channel Pacing Threshold Pulse Width: 0.4 ms
Lead Channel Sensing Intrinsic Amplitude: 14.625 mV
Lead Channel Sensing Intrinsic Amplitude: 14.625 mV
Lead Channel Sensing Intrinsic Amplitude: 2.5 mV
Lead Channel Sensing Intrinsic Amplitude: 2.5 mV
Lead Channel Setting Pacing Amplitude: 1.5 V
Lead Channel Setting Pacing Amplitude: 2 V
Lead Channel Setting Pacing Pulse Width: 0.4 ms
Lead Channel Setting Sensing Sensitivity: 1.2 mV

## 2020-06-05 NOTE — Progress Notes (Signed)
Remote pacemaker transmission.   

## 2020-06-10 DIAGNOSIS — I2510 Atherosclerotic heart disease of native coronary artery without angina pectoris: Secondary | ICD-10-CM | POA: Diagnosis not present

## 2020-06-10 DIAGNOSIS — I10 Essential (primary) hypertension: Secondary | ICD-10-CM | POA: Diagnosis not present

## 2020-06-10 DIAGNOSIS — Z7982 Long term (current) use of aspirin: Secondary | ICD-10-CM | POA: Diagnosis not present

## 2020-06-10 DIAGNOSIS — E860 Dehydration: Secondary | ICD-10-CM | POA: Diagnosis not present

## 2020-06-10 DIAGNOSIS — J9601 Acute respiratory failure with hypoxia: Secondary | ICD-10-CM | POA: Diagnosis not present

## 2020-06-10 DIAGNOSIS — U071 COVID-19: Secondary | ICD-10-CM | POA: Diagnosis not present

## 2020-06-10 DIAGNOSIS — E785 Hyperlipidemia, unspecified: Secondary | ICD-10-CM | POA: Diagnosis not present

## 2020-06-10 DIAGNOSIS — J1282 Pneumonia due to coronavirus disease 2019: Secondary | ICD-10-CM | POA: Diagnosis not present

## 2020-06-10 DIAGNOSIS — I252 Old myocardial infarction: Secondary | ICD-10-CM | POA: Diagnosis not present

## 2020-06-10 DIAGNOSIS — J9602 Acute respiratory failure with hypercapnia: Secondary | ICD-10-CM | POA: Diagnosis not present

## 2020-06-10 DIAGNOSIS — R410 Disorientation, unspecified: Secondary | ICD-10-CM | POA: Diagnosis not present

## 2020-06-10 DIAGNOSIS — W19XXXA Unspecified fall, initial encounter: Secondary | ICD-10-CM | POA: Diagnosis not present

## 2020-06-10 DIAGNOSIS — R531 Weakness: Secondary | ICD-10-CM | POA: Diagnosis not present

## 2020-06-10 DIAGNOSIS — Z66 Do not resuscitate: Secondary | ICD-10-CM | POA: Diagnosis not present

## 2020-06-10 DIAGNOSIS — Z79899 Other long term (current) drug therapy: Secondary | ICD-10-CM | POA: Diagnosis not present

## 2020-06-10 DIAGNOSIS — R0902 Hypoxemia: Secondary | ICD-10-CM | POA: Diagnosis not present

## 2020-06-10 DIAGNOSIS — Z743 Need for continuous supervision: Secondary | ICD-10-CM | POA: Diagnosis not present

## 2020-06-10 DIAGNOSIS — F17210 Nicotine dependence, cigarettes, uncomplicated: Secondary | ICD-10-CM | POA: Diagnosis not present

## 2020-06-10 DIAGNOSIS — R1310 Dysphagia, unspecified: Secondary | ICD-10-CM | POA: Diagnosis not present

## 2020-06-10 DIAGNOSIS — R4781 Slurred speech: Secondary | ICD-10-CM | POA: Diagnosis not present

## 2020-06-25 DEATH — deceased

## 2020-07-30 ENCOUNTER — Ambulatory Visit: Payer: Medicare Other | Admitting: Cardiology
